# Patient Record
Sex: Male | Born: 1955
Health system: Southern US, Community
[De-identification: ages and names within clinical notes are randomized; demographics above are authoritative.]

## PROBLEM LIST (undated history)

## (undated) DIAGNOSIS — I10 Essential (primary) hypertension: Secondary | ICD-10-CM

---

## 2018-01-11 DIAGNOSIS — N529 Male erectile dysfunction, unspecified: Secondary | ICD-10-CM | POA: Diagnosis not present

## 2018-01-11 DIAGNOSIS — Z6826 Body mass index (BMI) 26.0-26.9, adult: Secondary | ICD-10-CM | POA: Diagnosis not present

## 2018-01-11 DIAGNOSIS — Z1331 Encounter for screening for depression: Secondary | ICD-10-CM | POA: Diagnosis not present

## 2018-01-11 DIAGNOSIS — H6983 Other specified disorders of Eustachian tube, bilateral: Secondary | ICD-10-CM | POA: Diagnosis not present

## 2018-01-30 DIAGNOSIS — Z130 Encounter for screening for diseases of the blood and blood-forming organs and certain disorders involving the immune mechanism: Secondary | ICD-10-CM | POA: Diagnosis not present

## 2018-01-30 DIAGNOSIS — R739 Hyperglycemia, unspecified: Secondary | ICD-10-CM | POA: Diagnosis not present

## 2018-01-30 DIAGNOSIS — Z13228 Encounter for screening for other metabolic disorders: Secondary | ICD-10-CM | POA: Diagnosis not present

## 2018-01-30 DIAGNOSIS — Z13220 Encounter for screening for lipoid disorders: Secondary | ICD-10-CM | POA: Diagnosis not present

## 2018-01-30 DIAGNOSIS — Z0000 Encounter for general adult medical examination without abnormal findings: Secondary | ICD-10-CM | POA: Diagnosis not present

## 2018-02-06 DIAGNOSIS — Z0000 Encounter for general adult medical examination without abnormal findings: Secondary | ICD-10-CM | POA: Diagnosis not present

## 2018-02-06 DIAGNOSIS — F1010 Alcohol abuse, uncomplicated: Secondary | ICD-10-CM | POA: Diagnosis not present

## 2018-02-06 DIAGNOSIS — Z1211 Encounter for screening for malignant neoplasm of colon: Secondary | ICD-10-CM | POA: Diagnosis not present

## 2018-02-06 DIAGNOSIS — Z6825 Body mass index (BMI) 25.0-25.9, adult: Secondary | ICD-10-CM | POA: Diagnosis not present

## 2018-06-22 DIAGNOSIS — J069 Acute upper respiratory infection, unspecified: Secondary | ICD-10-CM | POA: Diagnosis not present

## 2019-09-02 DIAGNOSIS — J069 Acute upper respiratory infection, unspecified: Secondary | ICD-10-CM | POA: Diagnosis not present

## 2019-09-02 DIAGNOSIS — R030 Elevated blood-pressure reading, without diagnosis of hypertension: Secondary | ICD-10-CM | POA: Diagnosis not present

## 2019-09-02 DIAGNOSIS — Z20822 Contact with and (suspected) exposure to covid-19: Secondary | ICD-10-CM | POA: Diagnosis not present

## 2019-10-08 DIAGNOSIS — N390 Urinary tract infection, site not specified: Secondary | ICD-10-CM | POA: Diagnosis not present

## 2019-10-11 ENCOUNTER — Ambulatory Visit
Admission: RE | Admit: 2019-10-11 | Discharge: 2019-10-11 | Disposition: A | Discharge status: Home / Self Care | Payer: BC Managed Care – PPO | Source: Ambulatory Visit | Attending: Family Medicine | Admitting: Family Medicine

## 2019-10-11 ENCOUNTER — Other Ambulatory Visit: Payer: Self-pay

## 2019-10-11 ENCOUNTER — Encounter: Payer: Self-pay | Admitting: Family Medicine

## 2019-10-11 ENCOUNTER — Ambulatory Visit: Payer: BC Managed Care – PPO | Admitting: Family Medicine

## 2019-10-11 VITALS — BP 146/86 | HR 74 | Temp 97.8°F | Resp 12 | Ht 75.0 in | Wt 198.5 lb

## 2019-10-11 DIAGNOSIS — Z13220 Encounter for screening for lipoid disorders: Secondary | ICD-10-CM

## 2019-10-11 DIAGNOSIS — I10 Essential (primary) hypertension: Secondary | ICD-10-CM

## 2019-10-11 DIAGNOSIS — Z7689 Persons encountering health services in other specified circumstances: Secondary | ICD-10-CM

## 2019-10-11 DIAGNOSIS — Z8249 Family history of ischemic heart disease and other diseases of the circulatory system: Secondary | ICD-10-CM

## 2019-10-11 DIAGNOSIS — I160 Hypertensive urgency: Secondary | ICD-10-CM | POA: Diagnosis not present

## 2019-10-11 DIAGNOSIS — R319 Hematuria, unspecified: Secondary | ICD-10-CM | POA: Diagnosis not present

## 2019-10-11 DIAGNOSIS — N410 Acute prostatitis: Secondary | ICD-10-CM | POA: Diagnosis not present

## 2019-10-11 DIAGNOSIS — R911 Solitary pulmonary nodule: Secondary | ICD-10-CM | POA: Diagnosis not present

## 2019-10-11 MED ORDER — TAMSULOSIN HCL 0.4 MG PO CAPS: 0.4000 mg | ORAL_CAPSULE | Freq: Every day | ORAL | 0 refills | Status: DC

## 2019-10-11 MED ORDER — LISINOPRIL 10 MG PO TABS: 10.0000 mg | ORAL_TABLET | Freq: Every day | ORAL | 3 refills | Status: DC

## 2019-10-11 NOTE — Progress Notes (Signed)
Name: Douglas Heath   MRN: [id number]    DOB: 1955/09/04   Date:10/11/2019       Progress Note  Chief Complaint  Patient presents with  . New Patient (Initial Visit)  . Hypertension    blood pressure also very high at urgent care 199/113 on monday.  Has been checking at home.  . Urinary Tract Infection    went to urgent care was blood in urine and sent for culture, and antibiotic given. I will call to try to get results     Subjective:   Douglas Heath is a 64 y.o. male, presents to clinic to est care, for f/up on urinary sx and BP high  Hx of prostatitis, went to [facility] 10/08/2019 for sx of urgency, straining, dysuria, rectal pressure, hematuria  He had similar sx over and over again in his 30's, states dx with prostatitis, one time a MD gave him 60 d of abx, and then his sx improved and never returned until last week. No family hx of prostate CA No abd pain, N, V, D, has normal BMs Urinary sx are feeling a little better but not yet resolved, he believes he was only given 7 d of bactrim.  No hematuria today.    Father with heart disease CABG, pacemaker, only started having health issues in his 76's, no other family hx of cardiac disease  Pt states he hasn't had a regular PCP but did have access to a concierge MD and has done some labs when trying to get health insurance   Since his BP was high at [facility], he got a home BP monitor and has been checking his BP at home and its' been very high, UC reading was around 140/90's per pt and at home his readings are  190's/110's.    His whole life he has been very healthy, he's a runner, cyclist, very fit and athletic, and starting Nov (only 2-3 months ago) due to COVID, he was uinable to exercise at all.  He states he drinks at least 2 cups off coffee and 4-5 red bulls, uses nuff all day long, and he has a very stressful job, just moved into a house in [place], and he states he's also a heavy drinker at least 4 shots of tequila a night.    In the past  couple days he stopped snuff, cut back on ETOH and stopped redbulls, has not exercised yet, despite this Bps were increasing with his home monitor.  He brought his monitor in with him today, readings are higher than manual BP in clinic. He denies CP, SOB, exertional sx, palpitations, LE edema, orthopnea, PND, near syncope, HA's, visual disturbances.  [place], he temporarily traveled to [place] during COVID to stay with his brother, and just moved back.  He has not other med hx, only remembers tonsils out as a child   Patient Active Problem List   Diagnosis Date Noted  . Hypertension 10/15/2019  . Family history of cardiac disorder in father 10/15/2019    History reviewed. No pertinent surgical history.  Family History  Problem Relation Age of Onset  . Diabetes Mother   . Heart disease Father   . Leukemia Maternal Grandmother     Social History   Socioeconomic History  . Marital status: Married    Spouse name: Douglas Heath  . Number of children: 2  . Years of education: 34  . Highest education level: Bachelor's degree (e.g., BA,  AB, BS)  Occupational History  . Not on file  Tobacco Use  . Smoking status: Never Smoker  . Smokeless tobacco: Current User  Substance and Sexual Activity  . Alcohol use: Yes  . Drug use: Never  . Sexual activity: Yes  Other Topics Concern  . Not on file  Social History Narrative  . Not on file   Social Determinants of Health   Financial Resource Strain:   . Difficulty of Paying Living Expenses: Not on file  Food Insecurity:   . Worried About Programme researcher, broadcasting/film/video in the Last Year: Not on file  . Ran Out of Food in the Last Year: Not on file  Transportation Needs:   . Lack of Transportation (Medical): Not on file  . Lack of Transportation (Non-Medical): Not on file  Physical Activity:   . Days of Exercise per Week: Not on file  . Minutes of Exercise per Session: Not on file  Stress:   . Feeling of Stress :  Not on file  Social Connections:   . Frequency of Communication with Friends and Family: Not on file  . Frequency of Social Gatherings with Friends and Family: Not on file  . Attends Religious Services: Not on file  . Active Member of Clubs or Organizations: Not on file  . Attends Banker Meetings: Not on file  . Marital Status: Not on file  Intimate Partner Violence:   . Fear of Current or Ex-Partner: Not on file  . Emotionally Abused: Not on file  . Physically Abused: Not on file  . Sexually Abused: Not on file     Current Outpatient Medications:  .  sulfamethoxazole-trimethoprim (BACTRIM DS) 800-160 MG tablet, Take 1 tablet by mouth 2 (two) times daily., Disp: , Rfl:   No Known Allergies  Chart Review Today: I personally reviewed active problem list, medication list, allergies, family history, social history, health maintenance, notes from last encounter, lab results, imaging with the patient/caregiver today.   Review of Systems  Constitutional: Negative.   HENT: Negative.   Eyes: Negative.   Respiratory: Negative.   Cardiovascular: Negative.   Gastrointestinal: Negative.   Endocrine: Negative.   Genitourinary: Negative.   Musculoskeletal: Negative.   Skin: Negative.   Allergic/Immunologic: Negative.   Neurological: Negative.   Hematological: Negative.   Psychiatric/Behavioral: Negative.   All other systems reviewed and are negative.    Objective:    Vitals:   10/11/19 1035 10/11/19 1049  BP: (!) 142/88 (!) 146/86  Pulse: 74   Resp: 12   Temp: 97.8 F (36.6 C)   SpO2: 98%   Weight: 198 lb 8 oz (90 kg)   Height: 6\' 3"  (1.905 m)     Body mass index is 24.81 kg/m.  Physical Exam Vitals and nursing note reviewed.  Constitutional:      General: He is not in acute distress.    Appearance: Normal appearance. He is well-developed. He is not ill-appearing, toxic-appearing or diaphoretic.     Interventions: Face mask in place.  HENT:     Head:  Normocephalic and atraumatic.     Jaw: No trismus.     Right Ear: External ear normal.     Left Ear: External ear normal.  Eyes:     General: Lids are normal. No scleral icterus.    Conjunctiva/sclera: Conjunctivae normal.     Pupils: Pupils are equal, round, and reactive to light.  Neck:     Trachea: Trachea and phonation normal. No  tracheal deviation.  Cardiovascular:     Rate and Rhythm: Normal rate and regular rhythm.     Pulses: Normal pulses.          Radial pulses are 2+ on the right side and 2+ on the left side.       Posterior tibial pulses are 2+ on the right side and 2+ on the left side.     Heart sounds: Normal heart sounds. No murmur. No friction rub. No gallop.   Pulmonary:     Effort: Pulmonary effort is normal. No respiratory distress.     Breath sounds: Normal breath sounds. No stridor. No wheezing, rhonchi or rales.  Abdominal:     General: Bowel sounds are normal. There is no distension.     Palpations: Abdomen is soft.     Tenderness: There is no abdominal tenderness. There is no guarding or rebound.  Musculoskeletal:        General: Normal range of motion.     Cervical back: Normal range of motion and neck supple.     Right lower leg: No edema.     Left lower leg: No edema.  Skin:    General: Skin is warm and dry.     Capillary Refill: Capillary refill takes less than 2 seconds.     Coloration: Skin is not jaundiced.     Findings: No rash.     Nails: There is no clubbing.  Neurological:     Mental Status: He is alert.     Cranial Nerves: No dysarthria or facial asymmetry.     Motor: No tremor or abnormal muscle tone.     Gait: Gait normal.  Psychiatric:        Mood and Affect: Mood normal.        Speech: Speech normal.        Behavior: Behavior normal. Behavior is cooperative.     ECG interpretation   Date: 10/11/19  Rate: 54  Rhythm: sinus brady  QRS Axis: normal  Intervals: normal  ST/T Wave abnormalities: TWI in inferior leads   Conduction  Disutrbances: none  Old EKG Reviewed: none available Consulted with SP on ECG - per Dr. Carlynn Purl refer to Cardiology    PHQ2/9: Depression screen [facility] 2/9 10/11/2019  Decreased Interest 0  Down, Depressed, Hopeless 0  PHQ - 2 Score 0  Altered sleeping 0  Tired, decreased energy 0  Change in appetite 0  Feeling bad or failure about yourself  0  Trouble concentrating 0  Moving slowly or fidgety/restless 0  Suicidal thoughts 0  PHQ-9 Score 0  Difficult doing work/chores Not difficult at all    phq 9 is neg, reviewed today  Fall Risk: Fall Risk  10/11/2019  Falls in the past year? 0  Number falls in past yr: 0  Injury with Fall? 0    Functional Status Survey: Is the patient deaf or have difficulty hearing?: No Does the patient have difficulty seeing, even when wearing glasses/contacts?: No Does the patient have difficulty concentrating, remembering, or making decisions?: No Does the patient have difficulty walking or climbing stairs?: No Does the patient have difficulty dressing or bathing?: No Does the patient have difficulty doing errands alone such as visiting a doctor's office or shopping?: No   Assessment & Plan:     ICD-10-CM   1. Hypertension, unspecified type  I10 COMPLETE METABOLIC PANEL WITH GFR    lisinopril (ZESTRIL) 10 MG tablet   I suspect years of HTN, he  is dedicated to lifestyle changes, monitor falsly high - suggested other monitor and close f/up, start meds, check labs  2. Hypertensive urgency  I16.0 CBC with Differential/Platelet    COMPLETE METABOLIC PANEL WITH GFR    Lipid panel    Urinalysis, Routine w reflex microscopic    Microalbumin / creatinine urine ratio    EKG 12-Lead    DG Chest 2 View    Brain natriuretic peptide    lisinopril (ZESTRIL) 10 MG tablet   asx, will assess for end organ damage - EKG abd with TWI in inferior leads, so signs of STEMI or strain, labs and CXR, no past labs and VS to compare to  3. Hematuria, unspecified type   R31.9 sulfamethoxazole-trimethoprim (BACTRIM DS) 800-160 MG tablet    Urinalysis, Routine w reflex microscopic    tamsulosin (FLOMAX) 0.4 MG CAPS capsule   [facility] sx [facility] 3[facility]4 d [facility] - on bactrim, sx improving but not resolved, hx of prostatitis feels like his sx in the past  4. Acute prostatitis  N41.0 sulfamethoxazole-trimethoprim (BACTRIM DS) 800-160 MG tablet    tamsulosin (FLOMAX) 0.4 MG CAPS capsule   will need to change abx or at least continue abx for an additional 1-2 weeks, attempting to get UC records, rechecking urine today, added flomax  5. Family history of cardiac disorder in father  M09.47 COMPLETE METABOLIC PANEL WITH GFR    Lipid panel    EKG 12-Lead    DG Chest 2 View  6. Encounter to establish care with new doctor  Z76.89   7. Screening for lipoid disorders  S96.283 COMPLETE METABOLIC PANEL WITH GFR    Lipid panel   Discussed pt's ECG with him, would like to see him back in 2 weeks and reassess - discussed cardiology referral.  Return in about 2 weeks (around 10/25/2019) for bp recheck urine f/up.   Delsa Grana, PA-C 10/11/19 10:53 AM

## 2019-10-11 NOTE — Patient Instructions (Signed)
Hypertension, Adult Hypertension is another name for high blood pressure. High blood pressure forces your heart to work harder to pump blood. This can cause problems over time. There are two numbers in a blood pressure reading. There is a top number (systolic) over a bottom number (diastolic). It is best to have a blood pressure that is below 120/80. Healthy choices can help lower your blood pressure, or you may need medicine to help lower it. What are the causes? The cause of this condition is not known. Some conditions may be related to high blood pressure. What increases the risk?  Smoking.  Having type 2 diabetes mellitus, high cholesterol, or both.  Not getting enough exercise or physical activity.  Being overweight.  Having too much fat, sugar, calories, or salt (sodium) in your diet.  Drinking too much alcohol.  Having long-term (chronic) kidney disease.  Having a family history of high blood pressure.  Age. Risk increases with age.  Race. You may be at higher risk if you are African American.  Gender. Men are at higher risk than women before age 45. After age 65, women are at higher risk than men.  Having obstructive sleep apnea.  Stress. What are the signs or symptoms?  High blood pressure may not cause symptoms. Very high blood pressure (hypertensive crisis) may cause: ? Headache. ? Feelings of worry or nervousness (anxiety). ? Shortness of breath. ? Nosebleed. ? A feeling of being sick to your stomach (nausea). ? Throwing up (vomiting). ? Changes in how you see. ? Very bad chest pain. ? Seizures. How is this treated?  This condition is treated by making healthy lifestyle changes, such as: ? Eating healthy foods. ? Exercising more. ? Drinking less alcohol.  Your health care provider may prescribe medicine if lifestyle changes are not enough to get your blood pressure under control, and if: ? Your top number is above 130. ? Your bottom number is above  80.  Your personal target blood pressure may vary. Follow these instructions at home: Eating and drinking   If told, follow the DASH eating plan. To follow this plan: ? Fill one half of your plate at each meal with fruits and vegetables. ? Fill one fourth of your plate at each meal with whole grains. Whole grains include whole-wheat pasta, brown rice, and whole-grain bread. ? Eat or drink low-fat dairy products, such as skim milk or low-fat yogurt. ? Fill one fourth of your plate at each meal with low-fat (lean) proteins. Low-fat proteins include fish, chicken without skin, eggs, beans, and tofu. ? Avoid fatty meat, cured and processed meat, or chicken with skin. ? Avoid pre-made or processed food.  Eat less than 1,500 mg of salt each day.  Do not drink alcohol if: ? Your doctor tells you not to drink. ? You are pregnant, may be pregnant, or are planning to become pregnant.  If you drink alcohol: ? Limit how much you use to:  0-1 drink a day for women.  0-2 drinks a day for men. ? Be aware of how much alcohol is in your drink. In the [place] one drink equals one 12 oz bottle of beer (355 mL), one 5 oz glass of wine (148 mL), or one 1 oz glass of hard liquor (44 mL). Lifestyle   Work with your doctor to stay at a healthy weight or to lose weight. Ask your doctor what the best weight is for you.  Get at least 30 minutes of exercise most   days of the week. This may include walking, swimming, or biking.  Get at least 30 minutes of exercise that strengthens your muscles (resistance exercise) at least 3 days a week. This may include lifting weights or doing Pilates.  Do not use any products that contain nicotine or tobacco, such as cigarettes, e-cigarettes, and chewing tobacco. If you need help quitting, ask your doctor.  Check your blood pressure at home as told by your doctor.  Keep all follow-up visits as told by your doctor. This is important. Medicines  Take over-the-counter  and prescription medicines only as told by your doctor. Follow directions carefully.  Do not skip doses of blood pressure medicine. The medicine does not work as well if you skip doses. Skipping doses also puts you at risk for problems.  Ask your doctor about side effects or reactions to medicines that you should watch for. Contact a doctor if you:  Think you are having a reaction to the medicine you are taking.  Have headaches that keep coming back (recurring).  Feel dizzy.  Have swelling in your ankles.  Have trouble with your vision. Get help right away if you:  Get a very bad headache.  Start to feel mixed up (confused).  Feel weak or numb.  Feel faint.  Have very bad pain in your: ? Chest. ? Belly (abdomen).  Throw up more than once.  Have trouble breathing. Summary  Hypertension is another name for high blood pressure.  High blood pressure forces your heart to work harder to pump blood.  For most people, a normal blood pressure is less than 120/80.  Making healthy choices can help lower blood pressure. If your blood pressure does not get lower with healthy choices, you may need to take medicine. This information is not intended to replace advice given to you by your health care provider. Make sure you discuss any questions you have with your health care provider. Document Revised: 04/26/2018 Document Reviewed: 04/26/2018 Elsevier Patient Education  2020 [employer] Inc[employer]  DASH Eating Plan DASH stands for "Dietary Approaches to Stop Hypertension." The DASH eating plan is a healthy eating plan that has been shown to reduce high blood pressure (hypertension). It may also reduce your risk for type 2 diabetes, heart disease, and stroke. The DASH eating plan may also help with weight loss. What are tips for following this plan?  General guidelines  Avoid eating more than 2,300 mg (milligrams) of salt (sodium) a day. If you have hypertension, you may need to reduce  your sodium intake to 1,500 mg a day.  Limit alcohol intake to no more than 1 drink a day for nonpregnant women and 2 drinks a day for men. One drink equals 12 oz of beer, 5 oz of wine, or 1 oz of hard liquor.  Work with your health care provider to maintain a healthy body weight or to lose weight. Ask what an ideal weight is for you.  Get at least 30 minutes of exercise that causes your heart to beat faster (aerobic exercise) most days of the week. Activities may include walking, swimming, or biking.  Work with your health care provider or diet and nutrition specialist (dietitian) to adjust your eating plan to your individual calorie needs. Reading food labels   Check food labels for the amount of sodium per serving. Choose foods with less than 5 percent of the Daily Value of sodium. Generally, foods with less than 300 mg of sodium per serving fit into this eating  plan.  To find whole grains, look for the word "whole" as the first word in the ingredient list. Shopping  Buy products labeled as "low-sodium" or "no salt added."  Buy fresh foods. Avoid canned foods and premade or frozen meals. Cooking  Avoid adding salt when cooking. Use salt-free seasonings or herbs instead of table salt or sea salt. Check with your health care provider or pharmacist before using salt substitutes.  Do not fry foods. Cook foods using healthy methods such as baking, boiling, grilling, and broiling instead.  Cook with heart-healthy oils, such as olive, canola, soybean, or sunflower oil. Meal planning  Eat a balanced diet that includes: ? 5 or more servings of fruits and vegetables each day. At each meal, try to fill half of your plate with fruits and vegetables. ? Up to 6-8 servings of whole grains each day. ? Less than 6 oz of lean meat, poultry, or fish each day. A 3-oz serving of meat is about the same size as a deck of cards. One egg equals 1 oz. ? 2 servings of low-fat dairy each day. ? A serving  of nuts, seeds, or beans 5 times each week. ? Heart-healthy fats. Healthy fats called Omega-3 fatty acids are found in foods such as flaxseeds and coldwater fish, like sardines, salmon, and mackerel.  Limit how much you eat of the following: ? Canned or prepackaged foods. ? Food that is high in trans fat, such as fried foods. ? Food that is high in saturated fat, such as fatty meat. ? Sweets, desserts, sugary drinks, and other foods with added sugar. ? Full-fat dairy products.  Do not salt foods before eating.  Try to eat at least 2 vegetarian meals each week.  Eat more home-cooked food and less restaurant, buffet, and fast food.  When eating at a restaurant, ask that your food be prepared with less salt or no salt, if possible. What foods are recommended? The items listed may not be a complete list. Talk with your dietitian about what dietary choices are best for you. Grains Whole-grain or whole-wheat bread. Whole-grain or whole-wheat pasta. Brown rice. Orpah Cobb. Bulgur. Whole-grain and low-sodium cereals. Pita bread. Low-fat, low-sodium crackers. Whole-wheat flour tortillas. Vegetables Fresh or frozen vegetables (raw, steamed, roasted, or grilled). Low-sodium or reduced-sodium tomato and vegetable juice. Low-sodium or reduced-sodium tomato sauce and tomato paste. Low-sodium or reduced-sodium canned vegetables. Fruits All fresh, dried, or frozen fruit. Canned fruit in natural juice (without added sugar). Meat and other protein foods Skinless chicken or [place]. Ground chicken or [place]. Pork with fat trimmed off. Fish and seafood. Egg whites. Dried beans, peas, or lentils. Unsalted nuts, nut butters, and seeds. Unsalted canned beans. Lean cuts of beef with fat trimmed off. Low-sodium, lean deli meat. Dairy Low-fat (1%) or fat-free (skim) milk. Fat-free, low-fat, or reduced-fat cheeses. Nonfat, low-sodium ricotta or cottage cheese. Low-fat or nonfat yogurt. Low-fat, low-sodium  cheese. Fats and oils Soft margarine without trans fats. Vegetable oil. Low-fat, reduced-fat, or light mayonnaise and salad dressings (reduced-sodium). Canola, safflower, olive, soybean, and sunflower oils. Avocado. Seasoning and other foods Herbs. Spices. Seasoning mixes without salt. Unsalted popcorn and pretzels. Fat-free sweets. What foods are not recommended? The items listed may not be a complete list. Talk with your dietitian about what dietary choices are best for you. Grains Baked goods made with fat, such as croissants, muffins, or some breads. Dry pasta or rice meal packs. Vegetables Creamed or fried vegetables. Vegetables in a cheese sauce. Regular canned  vegetables (not low-sodium or reduced-sodium). Regular canned tomato sauce and paste (not low-sodium or reduced-sodium). Regular tomato and vegetable juice (not low-sodium or reduced-sodium). Angie Fava. Olives. Fruits Canned fruit in a light or heavy syrup. Fried fruit. Fruit in cream or butter sauce. Meat and other protein foods Fatty cuts of meat. Ribs. Fried meat. Berniece Salines. Sausage. Bologna and other processed lunch meats. Salami. Fatback. Hotdogs. Bratwurst. Salted nuts and seeds. Canned beans with added salt. Canned or smoked fish. Whole eggs or egg yolks. Chicken or [place] with skin. Dairy Whole or 2% milk, cream, and half-and-half. Whole or full-fat cream cheese. Whole-fat or sweetened yogurt. Full-fat cheese. Nondairy creamers. Whipped toppings. Processed cheese and cheese spreads. Fats and oils Butter. Stick margarine. Lard. Shortening. Ghee. Bacon fat. Tropical oils, such as coconut, palm kernel, or palm oil. Seasoning and other foods Salted popcorn and pretzels. Onion salt, garlic salt, seasoned salt, table salt, and sea salt. Worcestershire sauce. Tartar sauce. Barbecue sauce. Teriyaki sauce. Soy sauce, including reduced-sodium. Steak sauce. Canned and packaged gravies. Fish sauce. Oyster sauce. Cocktail sauce. Horseradish  that you find on the shelf. Ketchup. Mustard. Meat flavorings and tenderizers. Bouillon cubes. Hot sauce and Tabasco sauce. Premade or packaged marinades. Premade or packaged taco seasonings. Relishes. Regular salad dressings. Where to find more information:  National Heart, Lung, and [place]: https://wilson-eaton.com/  American Heart Association: www.heart.org Summary  The DASH eating plan is a healthy eating plan that has been shown to reduce high blood pressure (hypertension). It may also reduce your risk for type 2 diabetes, heart disease, and stroke.  With the DASH eating plan, you should limit salt (sodium) intake to 2,300 mg a day. If you have hypertension, you may need to reduce your sodium intake to 1,500 mg a day.  When on the DASH eating plan, aim to eat more fresh fruits and vegetables, whole grains, lean proteins, low-fat dairy, and heart-healthy fats.  Work with your health care provider or diet and nutrition specialist (dietitian) to adjust your eating plan to your individual calorie needs. This information is not intended to replace advice given to you by your health care provider. Make sure you discuss any questions you have with your health care provider. Document Revised: 07/29/2017 Document Reviewed: 08/09/2016 Elsevier Patient Education  2020 [employer].   How to Take Your Blood Pressure You can take your blood pressure at home with a machine. You may need to check your blood pressure at home:  To check if you have high blood pressure (hypertension).  To check your blood pressure over time.  To make sure your blood pressure medicine is working. Supplies needed: You will need a blood pressure machine, or monitor. You can buy one at a drugstore or online. When choosing one:  Choose one with an arm cuff.  Choose one that wraps around your upper arm. Only one finger should fit between your arm and the cuff.  Do not choose one that measures your blood pressure  from your wrist or finger. Your doctor can suggest a monitor. How to prepare Avoid these things for 30 minutes before checking your blood pressure:  Drinking caffeine.  Drinking alcohol.  Eating.  Smoking.  Exercising. Five minutes before checking your blood pressure:  Pee.  Sit in a dining chair. Avoid sitting in a soft couch or armchair.  Be quiet. Do not talk. How to take your blood pressure Follow the instructions that came with your machine. If you have a digital blood pressure monitor, these may  be the instructions: 1. Sit up straight. 2. Place your feet on the floor. Do not cross your ankles or legs. 3. Rest your left arm at the level of your heart. You may rest it on a table, desk, or chair. 4. Pull up your shirt sleeve. 5. Wrap the blood pressure cuff around the upper part of your left arm. The cuff should be 1 inch (2.5 cm) above your elbow. It is best to wrap the cuff around bare skin. 6. Fit the cuff snugly around your arm. You should be able to place only one finger between the cuff and your arm. 7. Put the cord inside the groove of your elbow. 8. Press the power button. 9. Sit quietly while the cuff fills with air and loses air. 10. Write down the numbers on the screen. 11. Wait 2-3 minutes and then repeat steps 1-10. What do the numbers mean? Two numbers make up your blood pressure. The first number is called systolic pressure. The second is called diastolic pressure. An example of a blood pressure reading is "120 over 80" (or 120/80). If you are an adult and do not have a medical condition, use this guide to find out if your blood pressure is normal: Normal  First number: below 120.  Second number: below 80. Elevated  First number: 120-129.  Second number: below 80. Hypertension stage 1  First number: 130-139.  Second number: 80-89. Hypertension stage 2  First number: 140 or above.  Second number: 90 or above. Your blood pressure is above  normal even if only the top or bottom number is above normal. Follow these instructions at home:  Check your blood pressure as often as your doctor tells you to.  Take your monitor to your next doctor's appointment. Your doctor will: ? Make sure you are using it correctly. ? Make sure it is working right.  Make sure you understand what your blood pressure numbers should be.  Tell your doctor if your medicines are causing side effects. Contact a doctor if:  Your blood pressure keeps being high. Get help right away if:  Your first blood pressure number is higher than 180.  Your second blood pressure number is higher than 120. This information is not intended to replace advice given to you by your health care provider. Make sure you discuss any questions you have with your health care provider. Document Revised: 07/29/2017 Document Reviewed: 01/23/2016 Elsevier Patient Education  2020 Elsevier Inc.   Blood Pressure Record Sheet To take your blood pressure, you will need a blood pressure machine. You can buy a blood pressure machine (blood pressure monitor) at your clinic, drug store, or online. When choosing one, consider:  An automatic monitor that has an arm cuff.  A cuff that wraps snugly around your upper arm. You should be able to fit only one finger between your arm and the cuff.  A device that stores blood pressure reading results.  Do not choose a monitor that measures your blood pressure from your wrist or finger. Follow your health care provider's instructions for how to take your blood pressure. To use this form:  Get one reading in the morning (a.m.) before you take any medicines.  Get one reading in the evening (p.m.) before supper.  Take at least 2 readings with each blood pressure check. This makes sure the results are correct. Wait 1-2 minutes between measurements.  Write down the results in the spaces on this form.  Repeat this once a  week, or as told by  your health care provider.  Make a follow-up appointment with your health care provider to discuss the results. Blood pressure log Date: _______________________  a.m. _____________________(1st reading) _____________________(2nd reading)  p.m. _____________________(1st reading) _____________________(2nd reading) Date: _______________________  a.m. _____________________(1st reading) _____________________(2nd reading)  p.m. _____________________(1st reading) _____________________(2nd reading) Date: _______________________  a.m. _____________________(1st reading) _____________________(2nd reading)  p.m. _____________________(1st reading) _____________________(2nd reading) Date: _______________________  a.m. _____________________(1st reading) _____________________(2nd reading)  p.m. _____________________(1st reading) _____________________(2nd reading) Date: _______________________  a.m. _____________________(1st reading) _____________________(2nd reading)  p.m. _____________________(1st reading) _____________________(2nd reading) This information is not intended to replace advice given to you by your health care provider. Make sure you discuss any questions you have with your health care provider. Document Revised: 10/14/2017 Document Reviewed: 08/16/2017 Elsevier Patient Education  2020 [employer].

## 2019-10-12 ENCOUNTER — Telehealth: Payer: Self-pay

## 2019-10-12 LAB — MICROALBUMIN / CREATININE URINE RATIO
Creatinine, Urine: 30 mg/dL (ref 20–320)
Microalb Creat Ratio: 17 mcg/mg creat (ref <30)
Microalb, Ur: 0.5 mg/dL

## 2019-10-12 LAB — CBC WITH DIFFERENTIAL/PLATELET
Absolute Monocytes: 316 cells/uL (ref 200–950)
Basophils Absolute: 28 cells/uL (ref 0–200)
Basophils Relative: 0.7 %
Eosinophils Absolute: 48 cells/uL (ref 15–500)
Eosinophils Relative: 1.2 %
HCT: 44.5 % (ref 38.5–50.0)
Hemoglobin: 15.8 g/dL (ref 13.2–17.1)
Lymphs Abs: 1188 cells/uL (ref 850–3900)
MCH: 32 pg (ref 27.0–33.0)
MCHC: 35.5 g/dL (ref 32.0–36.0)
MCV: 90.3 fL (ref 80.0–100.0)
MPV: 9.5 fL (ref 7.5–12.5)
Monocytes Relative: 7.9 %
Neutro Abs: 2420 cells/uL (ref 1500–7800)
Neutrophils Relative %: 60.5 %
Platelets: 168 10*3/uL (ref 140–400)
RBC: 4.93 10*6/uL (ref 4.20–5.80)
RDW: 13.1 % (ref 11.0–15.0)
Total Lymphocyte: 29.7 %
WBC: 4 10*3/uL (ref 3.8–10.8)

## 2019-10-12 LAB — COMPLETE METABOLIC PANEL WITH GFR
AG Ratio: 2.5 (calc) (ref 1.0–2.5)
ALT: 23 U/L (ref 9–46)
AST: 17 U/L (ref 10–35)
Albumin: 4.8 g/dL (ref 3.6–5.1)
Alkaline phosphatase (APISO): 56 U/L (ref 35–144)
BUN: 14 mg/dL (ref 7–25)
CO2: 30 mmol/L (ref 20–32)
Calcium: 9.3 mg/dL (ref 8.6–10.3)
Chloride: 105 mmol/L (ref 98–110)
Creat: 1.13 mg/dL (ref 0.70–1.25)
GFR, Est African American: 81 mL/min/{1.73_m2} (ref >=60)
GFR, Est Non African American: 70 mL/min/{1.73_m2} (ref >=60)
Globulin: 1.9 g/dL (calc) (ref 1.9–3.7)
Glucose, Bld: 101 mg/dL — ABNORMAL HIGH (ref 65–99)
Potassium: 4.9 mmol/L (ref 3.5–5.3)
Sodium: 139 mmol/L (ref 135–146)
Total Bilirubin: 0.6 mg/dL (ref 0.2–1.2)
Total Protein: 6.7 g/dL (ref 6.1–8.1)

## 2019-10-12 LAB — URINALYSIS, ROUTINE W REFLEX MICROSCOPIC
Bilirubin Urine: NEGATIVE
Glucose, UA: NEGATIVE
Hgb urine dipstick: NEGATIVE
Ketones, ur: NEGATIVE
Leukocytes,Ua: NEGATIVE
Nitrite: NEGATIVE
Protein, ur: NEGATIVE
Specific Gravity, Urine: 1.007 (ref 1.001–1.03)
pH: >=8.5 — AB (ref 5.0–8.0)

## 2019-10-12 LAB — LIPID PANEL
Cholesterol: 198 mg/dL (ref <200)
HDL: 60 mg/dL (ref >=40)
LDL Cholesterol (Calc): 122 mg/dL (calc) — ABNORMAL HIGH
Non-HDL Cholesterol (Calc): 138 mg/dL (calc) — ABNORMAL HIGH (ref <130)
Total CHOL/HDL Ratio: 3.3 (calc) (ref <5.0)
Triglycerides: 70 mg/dL (ref <150)

## 2019-10-12 LAB — BRAIN NATRIURETIC PEPTIDE: Brain Natriuretic Peptide: 14 pg/mL (ref <100)

## 2019-10-12 MED ORDER — CIPROFLOXACIN HCL 500 MG PO TABS: 500.0000 mg | ORAL_TABLET | Freq: Two times a day (BID) | ORAL | 0 refills | Status: DC

## 2019-10-12 NOTE — Telephone Encounter (Signed)
Copied from CRM [id number]. Topic: General - Inquiry >> Oct 12, 2019 12:43 PM Crist Infante wrote: Reason for CRM: Pt saw Leisa yesterday and was awaiting UA results.   Pt got the results of the culture back and it was negative.  They advise pt this is not a UTI.  Pt states it still hurts and burns to urinate, so something is wrong. pt would like Leisa or nurseto give him a call.

## 2019-10-12 NOTE — Telephone Encounter (Signed)
Labs normal please advise

## 2019-10-15 ENCOUNTER — Encounter: Payer: Self-pay | Admitting: Family Medicine

## 2019-10-15 DIAGNOSIS — Z8249 Family history of ischemic heart disease and other diseases of the circulatory system: Secondary | ICD-10-CM | POA: Insufficient documentation

## 2019-10-15 DIAGNOSIS — I10 Essential (primary) hypertension: Secondary | ICD-10-CM | POA: Insufficient documentation

## 2019-10-15 NOTE — Telephone Encounter (Signed)
Patient called back message received.Marland Kitchen

## 2019-10-15 NOTE — Telephone Encounter (Signed)
Left detailed voicemail

## 2019-10-16 ENCOUNTER — Telehealth: Payer: Self-pay

## 2019-10-16 DIAGNOSIS — Z23 Encounter for immunization: Secondary | ICD-10-CM | POA: Diagnosis not present

## 2019-10-16 NOTE — Telephone Encounter (Signed)
Douglas Heath, CMA  10/15/2019 10:10 AM EST    Pt refuses chol med at this time, he will work really hard on diet and exercise   Routing to [employer].

## 2019-10-22 ENCOUNTER — Telehealth: Payer: Self-pay | Admitting: Family Medicine

## 2019-10-22 NOTE — Telephone Encounter (Signed)
Pt seen leisa on 10-11-2019. Pt is still having burning and frequency when urinating. Pt also has fatigue. Please advise

## 2019-10-22 NOTE — Telephone Encounter (Signed)
Called pt denies any cardiac symptoms will follow-up with you tomorrow at 3:40.

## 2019-10-23 ENCOUNTER — Other Ambulatory Visit: Payer: Self-pay

## 2019-10-23 ENCOUNTER — Ambulatory Visit
Admission: RE | Admit: 2019-10-23 | Discharge: 2019-10-23 | Disposition: A | Discharge status: Home / Self Care | Payer: BC Managed Care – PPO | Source: Ambulatory Visit | Attending: Family Medicine | Admitting: Family Medicine

## 2019-10-23 ENCOUNTER — Ambulatory Visit: Payer: BC Managed Care – PPO | Admitting: Family Medicine

## 2019-10-23 ENCOUNTER — Encounter: Payer: Self-pay | Admitting: Family Medicine

## 2019-10-23 VITALS — BP 144/94 | HR 81 | Temp 97.1°F | Resp 16 | Ht 75.0 in | Wt 198.3 lb

## 2019-10-23 DIAGNOSIS — N4889 Other specified disorders of penis: Secondary | ICD-10-CM | POA: Diagnosis not present

## 2019-10-23 DIAGNOSIS — R319 Hematuria, unspecified: Secondary | ICD-10-CM | POA: Insufficient documentation

## 2019-10-23 DIAGNOSIS — N132 Hydronephrosis with renal and ureteral calculous obstruction: Secondary | ICD-10-CM | POA: Diagnosis not present

## 2019-10-23 DIAGNOSIS — R109 Unspecified abdominal pain: Secondary | ICD-10-CM | POA: Diagnosis not present

## 2019-10-23 DIAGNOSIS — N410 Acute prostatitis: Secondary | ICD-10-CM | POA: Diagnosis not present

## 2019-10-23 DIAGNOSIS — N400 Enlarged prostate without lower urinary tract symptoms: Secondary | ICD-10-CM

## 2019-10-23 DIAGNOSIS — R911 Solitary pulmonary nodule: Secondary | ICD-10-CM

## 2019-10-23 DIAGNOSIS — R300 Dysuria: Secondary | ICD-10-CM | POA: Insufficient documentation

## 2019-10-23 DIAGNOSIS — N289 Disorder of kidney and ureter, unspecified: Secondary | ICD-10-CM

## 2019-10-23 DIAGNOSIS — N200 Calculus of kidney: Secondary | ICD-10-CM | POA: Diagnosis not present

## 2019-10-23 DIAGNOSIS — K5730 Diverticulosis of large intestine without perforation or abscess without bleeding: Secondary | ICD-10-CM | POA: Insufficient documentation

## 2019-10-23 LAB — POCT URINALYSIS DIPSTICK
Bilirubin, UA: NEGATIVE
Blood, UA: POSITIVE
Glucose, UA: NEGATIVE
Ketones, UA: NEGATIVE
Leukocytes, UA: NEGATIVE
Nitrite, UA: NEGATIVE
Odor: NORMAL
Protein, UA: NEGATIVE
Spec Grav, UA: 1.02 (ref 1.010–1.025)
Urobilinogen, UA: 0.2 E.U./dL
pH, UA: 7 (ref 5.0–8.0)

## 2019-10-23 MED ORDER — PHENAZOPYRIDINE HCL 200 MG PO TABS: 200.0000 mg | ORAL_TABLET | Freq: Three times a day (TID) | ORAL | 0 refills | Status: DC

## 2019-10-23 MED ORDER — TAMSULOSIN HCL 0.4 MG PO CAPS: 0.8000 mg | ORAL_CAPSULE | Freq: Every day | ORAL | 0 refills | Status: DC

## 2019-10-23 MED ORDER — KETOROLAC TROMETHAMINE 60 MG/2ML IM SOLN
60.0000 mg | Freq: Once | INTRAMUSCULAR | Status: AC
  Administered 2019-10-23: 60 mg via INTRAMUSCULAR

## 2019-10-23 MED ORDER — TRAMADOL HCL 50 MG PO TABS: 50.0000 mg | ORAL_TABLET | Freq: Three times a day (TID) | ORAL | 0 refills | Status: DC | PRN

## 2019-10-23 MED ORDER — ONDANSETRON 4 MG PO TBDP: 4.0000 mg | ORAL_TABLET | Freq: Three times a day (TID) | ORAL | 0 refills | Status: DC | PRN

## 2019-10-23 NOTE — Patient Instructions (Signed)
Increase flomax dose at night  Start pyridium for pain  Continue antibiotics  Can take tramadol for pain as needed and zofran as needed for nausea  Any severe worsening go to ER or Urgent care for further help/evaluation/treatment  Hopefully with repeated urine testing and CT we can determine the cause of this  I have put in for urology referral - so you should hear from them in the next 1-2 days

## 2019-10-23 NOTE — Progress Notes (Signed)
Patient ID: Douglas Heath, male    DOB: 1956-08-09, 64 y.o.   MRN: [id number]  PCP: Danelle Berry, PA-C  Chief Complaint  Patient presents with  . Dysuria    Subjective:   Douglas Heath is a 64 y.o. male, presents to clinic with CC of the following: Patient presented for follow-up on increased dysuria became much more severe yesterday with associated generalized malaise and fatigue.  He send [place] a MyChart message and he was encouraged to come in today for evaluation.  Patient established here 12 days ago, he had urinary urgency straining dysuria, rectal pressure and hematuria that started on 10/08/2019 he originally presented to urgent care was given some antibiotics and followed up in our office about 3 days later at that time he noted that it was similar to past episodes of prostatitis when he was about 32, he stated that he was beginning to feel little bit better, his urine was rechecked with a culture added -urinalysis, microscopy and urine culture were all negative he had no hematuria at the time of office visit on 2/11.  We discussed at that time him finishing the Bactrim that he was given from urgent care since he was starting to feel better and we would likely switch him to a different medication for longer coverage of prostatitis we decided on Cipro for him to continue for an additional 2 weeks and he did have a follow-up scheduled before he would have completed his antibiotics.  Unfortunately he did call the office shortly after his visit and then again on Monday, yesterday concerned about continued urinary symptoms.  He was encouraged to take the Cipro, he had been given Flomax and despite completing Bactrim and taking over a week of Cipro yesterday his pain began very intense and severe.  When pain is been extremely severe he has had some mild associated nausea.  No other alleviating or aggravating factors  Pain has been constant with some waves of worsening or more intense pain he states is  located throughout his penis and urethra, and has worsened over the last 1 to 2 days and now has pain constantly from his low suprapubic area to the end of his penis.  He states it is persistent, so severe he was unable to sleep.  He noted decreased appetite, generally feels malaised and fatigued, was able to work today but was very stressed. He denies any change to urine output, urinary frequency, denies penile discharge, denies any scrotal or testicle pain swelling, denies any genital rash lesions or possibility of STIs, denies hematuria or change in urine color or odor.  He has no upper abdominal pain denies any flank or back pain.   Further denies any fever chills sweats vomiting diarrhea, constipation, abdominal bloating, indigestion.  Urine dip from today was positive for blood and otherwise unremarkable Results for orders placed or performed in visit on 10/23/19  POCT Urinalysis Dipstick  Result Value Ref Range   Color, UA yellow    Clarity, UA clear    Glucose, UA Negative Negative   Bilirubin, UA neg    Ketones, UA neg    Spec Grav, UA 1.020 1.010 - 1.025   Blood, UA positive    pH, UA 7.0 5.0 - 8.0   Protein, UA Negative Negative   Urobilinogen, UA 0.2 0.2 or 1.0 E.U./dL   Nitrite, UA neg    Leukocytes, UA Negative Negative   Appearance clear    Odor normal  Patient Active Problem List   Diagnosis Date Noted  . Hypertension 10/15/2019  . Family history of cardiac disorder in father 10/15/2019      Current Outpatient Medications:  .  ciprofloxacin (CIPRO) 500 MG tablet, Take 1 tablet (500 mg total) by mouth every 12 (twelve) hours for 14 days., Disp: 28 tablet, Rfl: 0 .  lisinopril (ZESTRIL) 10 MG tablet, Take 1 tablet (10 mg total) by mouth daily., Disp: 90 tablet, Rfl: 3 .  tamsulosin (FLOMAX) 0.4 MG CAPS capsule, Take 1 capsule (0.4 mg total) by mouth at bedtime. Prn for urinary symptoms, Disp: 30 capsule, Rfl: 0   No Known Allergies   Family History  Problem  Relation Age of Onset  . Diabetes Mother   . Obesity Mother   . Heart disease Father   . Leukemia Maternal Grandmother      Social History   Tobacco Use  . Smoking status: Never Smoker  . Smokeless tobacco: Current User    Types: Snuff  Substance Use Topics  . Alcohol use: Yes    Alcohol/week: 28.0 standard drinks    Types: 28 Shots of liquor per week  . Drug use: Never     Chart Review Today: I personally reviewed active problem list, medication list, allergies, family history, social history, health maintenance, notes from last encounter, lab results, imaging with the patient/caregiver today.   Review of Systems  Constitutional: Negative.   HENT: Negative.   Eyes: Negative.   Respiratory: Negative.   Cardiovascular: Negative.   Gastrointestinal: Positive for abdominal pain and nausea. Negative for abdominal distention, anal bleeding, blood in stool, constipation, diarrhea, rectal pain and vomiting.  Endocrine: Negative.   Genitourinary: Positive for difficulty urinating, dysuria, hematuria and penile pain. Negative for decreased urine volume, discharge, flank pain, genital sores, penile swelling and testicular pain.  Musculoskeletal: Negative.   Skin: Negative.   Allergic/Immunologic: Negative.   Neurological: Negative.  Negative for dizziness and headaches.  Hematological: Negative.   Psychiatric/Behavioral: Negative.   All other systems reviewed and are negative.      Objective:   Vitals:   10/23/19 1600  BP: (!) 144/94  Pulse: 81  Resp: 16  Temp: (!) 97.1 F (36.2 C)  TempSrc: Temporal  SpO2: 98%  Weight: 198 lb 4.8 oz (89.9 kg)  Height: 6\' 3"  (1.905 m)    Body mass index is 24.79 kg/m.  Physical Exam Vitals and nursing note reviewed.  Constitutional:      General: He is not in acute distress.    Appearance: Normal appearance. He is well-developed. He is not ill-appearing, toxic-appearing or diaphoretic.     Interventions: Face mask in place.      Comments: Well-appearing male no acute distress  HENT:     Head: Normocephalic and atraumatic.     Jaw: No trismus.  Eyes:     General: Lids are normal. No scleral icterus.    Conjunctiva/sclera: Conjunctivae normal.  Neck:     Trachea: Trachea and phonation normal. No tracheal deviation.  Cardiovascular:     Rate and Rhythm: Normal rate and regular rhythm.     Pulses: Normal pulses.          Radial pulses are 2+ on the right side and 2+ on the left side.       Posterior tibial pulses are 2+ on the right side and 2+ on the left side.     Heart sounds: Normal heart sounds. No murmur. No friction rub. No gallop.  Pulmonary:     Effort: Pulmonary effort is normal. No respiratory distress.     Breath sounds: Normal breath sounds. No stridor. No wheezing, rhonchi or rales.  Abdominal:     General: Abdomen is flat. Bowel sounds are normal. There is no distension.     Palpations: Abdomen is soft.     Tenderness: There is no abdominal tenderness. There is no right CVA tenderness, left CVA tenderness, guarding or rebound. Negative signs include Rovsing's sign and McBurney's sign.  Genitourinary:    Comments: Declines GU exam Musculoskeletal:        General: Normal range of motion.     Cervical back: Normal range of motion and neck supple.     Right lower leg: No edema.     Left lower leg: No edema.  Skin:    General: Skin is warm and dry.     Coloration: Skin is not jaundiced or pale.     Nails: There is no clubbing.  Neurological:     Mental Status: He is alert and oriented to person, place, and time.     Cranial Nerves: No dysarthria or facial asymmetry.     Motor: No tremor or abnormal muscle tone.     Coordination: Coordination normal.     Gait: Gait normal.  Psychiatric:        Mood and Affect: Mood normal.        Speech: Speech normal.        Behavior: Behavior normal. Behavior is cooperative.      Results for orders placed or performed in visit on 10/23/19  POCT  Urinalysis Dipstick  Result Value Ref Range   Color, UA yellow    Clarity, UA clear    Glucose, UA Negative Negative   Bilirubin, UA neg    Ketones, UA neg    Spec Grav, UA 1.020 1.010 - 1.025   Blood, UA positive    pH, UA 7.0 5.0 - 8.0   Protein, UA Negative Negative   Urobilinogen, UA 0.2 0.2 or 1.0 E.U./dL   Nitrite, UA neg    Leukocytes, UA Negative Negative   Appearance clear    Odor normal         Assessment & Plan:      ICD-10-CM   1. Dysuria  R30.0 POCT Urinalysis Dipstick    Urine Culture    CT RENAL STONE STUDY    tamsulosin (FLOMAX) 0.4 MG CAPS capsule    phenazopyridine (PYRIDIUM) 200 MG tablet   worsening after 2-3 weeks abx  2. Hematuria, unspecified type  R31.9 CT RENAL STONE STUDY    tamsulosin (FLOMAX) 0.4 MG CAPS capsule   UA today + blood, recent urine culture X 2 neg  3. Abdominal pain, unspecified abdominal location  R10.9 traMADol (ULTRAM) 50 MG tablet    ondansetron (ZOFRAN ODT) 4 MG disintegrating tablet   r/o kidney stone, pyelo, may be continued prostatitis? UTI? does not want STDs - low risk no GI sx, + hematuria, penile pain, suprapubic pain  4. Acute prostatitis  N41.0    hx of, start of his sx 2-3 weeks ago was just like past prostatitis, no improvement with abx  5. Penile pain  N48.89 POCT Urinalysis Dipstick    Urine Culture    tamsulosin (FLOMAX) 0.4 MG CAPS capsule    phenazopyridine (PYRIDIUM) 200 MG tablet   worsening severe constant pain, no discharge, low risk for STDs    Stat imaging ordered today while patient was in  clinic and toradol injection in clinic - currently waiting for insurance approval and scheduling   Hx of prostatitis with recent urinary sx that were just like prostatitis sx from when he was 32 Treated at UC with bactrim, switched to cipro, UA and culture neg x 2, worsening urinary sx, low abd pain, penile pain, hematuria with repeat dip, unsure if prostatitis vs  Nephrolithiasis -unlikely to be UTI with 2 separate  rounds of antibiotics in the last 2 to 3 weeks  CT renal stone study and repeated urine culture pending  Pt overall well appearing, non-toxic appearing, but constitutional sx are vague and generalized - malaise, fatigue No F, S, C, V/D - some nausea associated with worsening waves of pain.  No concerning GI symptoms, patient low risk for STIs and he did not want to do testing today or genital exam.    Discussed the plan with the patient today including treating pain even though he declined pain medication I did send through tramadol and Zofran, he did request Pyridium since it had helped him in the past, increase Flomax dose, continue antibiotics for now  Patient was in the exam room from about 4 PM to 5 PM today, after we discussed the plan and all the authorization was completed to get his CT scan scheduled it was scheduled today and patient was going to leave the office with his after visit summary with his plan and medications outlined.  He did request asked me question and Asher Muir brought him back to my office he did inquire about a lung nodule which was incidentally found on his chest x-ray.   He did review the imaging results on my chart, I had decided to not give him those results over the phone because he was going to return in 2 weeks to review his blood pressure and I was planning to review them in person with going over the chest x-ray and the images and discussed the follow-up.  We briefly discussed this today, and we will review the images when he returns next week for his hypertension follow-up.  Will refer over to Glenna Fellows for lung nodule team - pt notified.    Danelle Berry, PA-C 10/23/19 4:13 PM

## 2019-10-23 NOTE — Progress Notes (Signed)
Pts renal CT stone study (stat) resulted: I spoke with the Radiology tech and then with the pt from 6:10 pm to  6:20 PM   Ct was pertinent for left nephrolithiasis 12 mm stone at the left ureterovesical junction with mild left hydronephro ureter, no hydronephrosis or nephrolithiasis on the right Discussed the findings with the patient -previously prescribed medicines as noted below are treatment for nephrolithiasis, Toradol given in clinic, patient to take 0.8 mg Flomax in the evenings before bedtime, can use Pyridium, gave tramadol and Zofran, and put an urgent referral to urology I did encourage him to keep that follow-up appointment.  Hopefully he will be able to pass the stone, did discuss how it was fairly large and would like him to keep the follow-up in case he does not pass a stone or he requires procedure and he will already have established with urology.  Encouraged him to continue the Cipro until he sees urology and they can advise from there on discontinuing or completing course.  Patient does not have a history of kidney stones -he will likely be able to discuss further about this causes etc. with urology or with his follow-up visit here  Did discuss all of findings on the CT scan which included a incidental right kidney hypodense lesion, sigmoid diverticulosis without inflammatory changes, mild aortoiliac atherosclerotic disease which we discussed at length what that is and treatment Prostate was enlarged -  Dx added to chart - pt was encouraged to f/up with our office if he does not hear anything by Friday (end of week)  Pt has f/up next week for BP, so we do have close follow up    ICD-10-CM   1. Left nephrolithiasis  N20.0    see plan above  2. Dysuria  R30.0 POCT Urinalysis Dipstick    Urine Culture    CT RENAL STONE STUDY    tamsulosin (FLOMAX) 0.4 MG CAPS capsule    phenazopyridine (PYRIDIUM) 200 MG tablet    Ambulatory referral to Urology    ketorolac (TORADOL)  injection 60 mg   worsening after 2-3 weeks abx  3. Hematuria, unspecified type  R31.9 CT RENAL STONE STUDY    tamsulosin (FLOMAX) 0.4 MG CAPS capsule    Ambulatory referral to Urology   UA today + blood, recent urine culture X 2 neg  4. Abdominal pain, unspecified abdominal location  R10.9 traMADol (ULTRAM) 50 MG tablet    ondansetron (ZOFRAN ODT) 4 MG disintegrating tablet    Ambulatory referral to Urology    ketorolac (TORADOL) injection 60 mg   r/o kidney stone, pyelo, may be continued prostatitis? UTI? does not want STDs - low risk no GI sx, + hematuria, penile pain, suprapubic pain  5. Acute prostatitis  N41.0 Ambulatory referral to Urology    ketorolac (TORADOL) injection 60 mg   hx of, start of his sx 2-3 weeks ago was just like past prostatitis, no improvement with abx  6. Penile pain  N48.89 POCT Urinalysis Dipstick    Urine Culture    tamsulosin (FLOMAX) 0.4 MG CAPS capsule    phenazopyridine (PYRIDIUM) 200 MG tablet    ketorolac (TORADOL) injection 60 mg   worsening severe constant pain, no discharge, low risk for STDs  7. Sigmoid diverticulosis  K57.30    discussed briefly with pt  8. Enlarged prostate  N40.0    urology may be able to further evaluate - no hx of recent LUTS, only acute dysuria which pt suspected with prostatitis, may  have been kidney stone?  9. Nodule of left lung  R91.1    will discuss further at his f/up appt, will need the f/up chest CT  10. Lesion of right native kidney  N28.9    19mm lesion       He did ask me about lung nodule in his CXR and we briefly discussed it, when it looked like its size location and the recommended follow-up and I did explain to him that I wanted to discuss that with him in person so we had time to discuss it and I did not want to alarm him by having the nurse call and that his results he had reviewed on my chart.  All questions asked and answered today.  Additional 20 min this evening additional to his appt this  afternoon spent in pt's care.    Greater than 50% of this visit was spent in direct face-to-face counseling, obtaining history and physical, discussing and educating pt on treatment plan.  Total time of this visit was > 50 min.  Remainder of time involved but was not limited to reviewing chart (recent and pertinent OV notes and labs), documentation in EMR, and coordinating care and treatment plan.  Delsa Grana, PA-C 10/23/19 6:33 PM

## 2019-10-24 ENCOUNTER — Encounter: Payer: Self-pay | Admitting: Urology

## 2019-10-24 ENCOUNTER — Other Ambulatory Visit: Payer: Self-pay | Admitting: Urology

## 2019-10-24 ENCOUNTER — Ambulatory Visit (INDEPENDENT_AMBULATORY_CARE_PROVIDER_SITE_OTHER): Payer: BC Managed Care – PPO | Admitting: Urology

## 2019-10-24 ENCOUNTER — Ambulatory Visit: Payer: BC Managed Care – PPO | Admitting: Family Medicine

## 2019-10-24 ENCOUNTER — Other Ambulatory Visit
Admission: RE | Admit: 2019-10-24 | Discharge: 2019-10-24 | Disposition: A | Discharge status: Home / Self Care | Payer: BC Managed Care – PPO | Source: Ambulatory Visit | Attending: Urology | Admitting: Urology

## 2019-10-24 VITALS — BP 172/101 | HR 78 | Ht 75.0 in | Wt 179.0 lb

## 2019-10-24 DIAGNOSIS — Z01812 Encounter for preprocedural laboratory examination: Secondary | ICD-10-CM | POA: Insufficient documentation

## 2019-10-24 DIAGNOSIS — N200 Calculus of kidney: Secondary | ICD-10-CM

## 2019-10-24 DIAGNOSIS — Z20822 Contact with and (suspected) exposure to covid-19: Secondary | ICD-10-CM | POA: Insufficient documentation

## 2019-10-24 LAB — URINE CULTURE
MICRO NUMBER:: 10179921
Result:: NO GROWTH
SPECIMEN QUALITY:: ADEQUATE

## 2019-10-24 MED ORDER — SODIUM CHLORIDE (PF) 0.9 % IJ SOLN: 100.0000 mL | Freq: Once | INTRAMUSCULAR | Status: AC

## 2019-10-24 NOTE — H&P (View-Only) (Signed)
 10/24/2019 8:32 AM   Douglas Heath 08/31/1955 [id number]  Referring provider: Tapia, Leisa, PA-C [address] [place],  [place] [address]  Chief Complaint  Patient presents with  . Nephrolithiasis    HPI: Douglas Heath is a 64 yo M pt who presents today for the evaluation and management of kidney stones.   He presented to primary care with urinary symptoms (10/12/2019)  of dysuria, rectal pressure, hematuria diagnosed with presumed prostatitis. He was given a course of 7 days of bactrim w/o resolution of symptoms.   He obtained a CT yesterday showing a 12 mm UVJ stone with hydroureteronephrosis.  No other stone burden.  He reports of pain in his penis with left side a little worse.   He is currently taking cipro.  UA benign appearing.  He reports of visiting a Urologist in the summer and was informed of having Peyronie disease which he has not treated due to cost.   He described upward stable curvature without pain, stable curve.  No pain.  Able to penetrate without difficulty.    PMH: Past Medical History:  Diagnosis Date  . Hypertension      Surgical History: No past surgical history on file.  Home Medications:  Allergies as of 10/24/2019   No Known Allergies     Medication List       Accurate as of October 24, 2019 11:59 PM. If you have any questions, ask your nurse or doctor.        ciprofloxacin 500 MG tablet Commonly known as: CIPRO Take 500 mg by mouth 2 (two) times daily. What changed: Another medication with the same name was removed. Continue taking this medication, and follow the directions you see here. Changed by: Ezri Fanguy, MD   lisinopril 10 MG tablet Commonly known as: ZESTRIL Take 1 tablet (10 mg total) by mouth daily.   ondansetron 4 MG disintegrating tablet Commonly known as: Zofran ODT Take 1 tablet (4 mg total) by mouth every 8 (eight) hours as needed for nausea.   phenazopyridine 200 MG tablet Commonly known as: PYRIDIUM  Take 1 tablet (200 mg total) by mouth 3 (three) times daily.   tamsulosin 0.4 MG Caps capsule Commonly known as: FLOMAX Take 2 capsules (0.8 mg total) by mouth at bedtime.   traMADol 50 MG tablet Commonly known as: ULTRAM Take 1-2 tablets (50-100 mg total) by mouth 3 (three) times daily as needed for severe pain.       Allergies: No Known Allergies  Family History: Family History  Problem Relation Age of Onset  . Diabetes Mother   . Obesity Mother   . Heart disease Father   . Leukemia Maternal Grandmother     Social History:  reports that he has never smoked. His smokeless tobacco use includes snuff. He reports current alcohol use of about 28.0 standard drinks of alcohol per week. He reports that he does not use drugs.   Physical Exam: BP (!) 172/101   Pulse 78   Ht 6' 3" (1.905 m)   Wt 179 lb (81.2 kg)   BMI 22.37 kg/m   Constitutional:  Alert and oriented, No acute distress. HEENT: [place] AT, moist mucus membranes.  Trachea midline, no masses. Cardiovascular: No clubbing, cyanosis, or edema. Respiratory: Normal respiratory effort, no increased work of breathing. Skin: No rashes, bruises or suspicious lesions. Neurologic: Grossly intact, no focal deficits, moving all 4 extremities. Psychiatric: Normal mood and affect.  Laboratory Data: Lab Results  Component Value Date     WBC 4.0 10/11/2019   HGB 15.8 10/11/2019   HCT 44.5 10/11/2019   MCV 90.3 10/11/2019   PLT 168 10/11/2019    Lab Results  Component Value Date   CREATININE 1.13 10/11/2019   Urinalysis Results for orders placed or performed in visit on 10/23/19  Urine Culture   Specimen: Urine  Result Value Ref Range   MICRO NUMBER: [id number]    SPECIMEN QUALITY: Adequate    Sample Source URINE    STATUS: FINAL    Result: No Growth   POCT Urinalysis Dipstick  Result Value Ref Range   Color, UA yellow    Clarity, UA clear    Glucose, UA Negative Negative   Bilirubin, UA neg    Ketones, UA neg     Spec Grav, UA 1.020 1.010 - 1.025   Blood, UA positive    pH, UA 7.0 5.0 - 8.0   Protein, UA Negative Negative   Urobilinogen, UA 0.2 0.2 or 1.0 E.U./dL   Nitrite, UA neg    Leukocytes, UA Negative Negative   Appearance clear    Odor normal     Pertinent Imaging: Results for orders placed in visit on 10/23/19  CT RENAL STONE STUDY   Narrative CLINICAL DATA:  62-year-old male with hematuria.  EXAM: CT ABDOMEN AND PELVIS WITHOUT CONTRAST  TECHNIQUE: Multidetector CT imaging of the abdomen and pelvis was performed following the standard protocol without IV contrast.  COMPARISON:  None.  FINDINGS: Evaluation of this exam is limited in the absence of intravenous contrast.  Lower chest: The visualized lung bases are clear.  No intra-abdominal free air or free fluid.  Hepatobiliary: No focal liver abnormality is seen. No gallstones, gallbladder wall thickening, or biliary dilatation.  Pancreas: Unremarkable. No pancreatic ductal dilatation or surrounding inflammatory changes.  Spleen: Normal in size without focal abnormality.  Adrenals/Urinary Tract: The adrenal glands are unremarkable. There is a stone at the left ureterovesical junction which measures up to 12 mm in length. There is mild left hydro nephro ureter. There is no hydronephrosis or nephrolithiasis on the right. A 9 mm exophytic hypodense lesion from the medial inferior pole of the right kidney is not characterized but may represent a cyst. The right ureter is unremarkable. The urinary bladder is unremarkable as well.  Stomach/Bowel: There is sigmoid diverticulosis without active inflammatory changes. There is moderate stool throughout the colon. There is no bowel obstruction or active inflammation. The appendix is normal.  Vascular/Lymphatic: Mild aortoiliac atherosclerotic disease. The IVC is unremarkable. No portal venous gas. There is no adenopathy.  Reproductive: Enlarged prostate gland measuring 6  cm in transverse axial diameter. The seminal vesicles are symmetric.  Other: Metallic coils in the scrotum likely related to bursectomy.  Musculoskeletal: Mild degenerative changes of the spine. No acute osseous pathology.  IMPRESSION: 1. A 12 mm left UVJ stone with mild left hydronephrosis. 2. Sigmoid diverticulosis. No bowel obstruction or active inflammation. Normal appendix.   Electronically Signed   By: Arash  Radparvar M.D.   On: 10/23/2019 18:01    I have personally reviewed radiological imaging and noted 12 mm left UVJ stone with mild left hydronephrosis.   Assessment & Plan:    1. Left distal urereal calculus  We discussed various treatment options including ESWL vs. ureteroscopy, laser lithotripsy, and stent. We discussed the risks and benefits of both including bleeding, infection, damage to surrounding structures, efficacy with need for possible further intervention, and need for temporary ureteral stent. Given size of stone and   duration, unlikely he'll pass it spontaneously  Continue Flomax  Pt elected for Lithotripsy Warning symptoms reviewed  2.Left hydroureteronephrosis See number 1   3. Dysuria  Irritated urinary symptoms related to proximal stone close to bladder  No concern for infection   4. Gross Hematuria  Likely secondary to stone will recheck when stone resolves  5. Peyronie's Disease Pathophysiology was discussed at length including the 2 phases of the diease, acute and chronic.  We discussed that this medication is administered in cycles which include 2 injections of the medication into the penile plaque followed by modeling procedure with 6 weeks of home exercises. Goals of treatment were reviewed which include improvement of penile curvature ideally to facilitate sexual function/penetration but likely not complete resolution of the curvature. Risks including failure of the medication, penile hematoma/edema, pain, and penile fracture were all  discussed. All of his questions were answered.  Pt not interest in intervention at this time   [facility] [address], [address] [place], [place] [address] [phone]  I, Nethusan Sivanesan, am acting as a scribe for Dr. Elora Wolter,  Meranda Dechaine, MD   

## 2019-10-24 NOTE — Progress Notes (Signed)
10/24/2019 8:32 AM   Douglas Heath [birth year] [id number]  Referring provider: Delsa Grana, PA-C [address] [place],  [place] [address]  Chief Complaint  Patient presents with  . Nephrolithiasis    HPI: Douglas Heath is a 64 yo M pt who presents today for the evaluation and management of kidney stones.   He presented to primary care with urinary symptoms (10/12/2019)  of dysuria, rectal pressure, hematuria diagnosed with presumed prostatitis. He was given a course of 7 days of bactrim w/o resolution of symptoms.   He obtained a CT yesterday showing a 12 mm UVJ stone with hydroureteronephrosis.  No other stone burden.  He reports of pain in his penis with left side a little worse.   He is currently taking cipro.  UA benign appearing.  He reports of visiting a Urologist in the summer and was informed of having Peyronie disease which he has not treated due to cost.   He described upward stable curvature without pain, stable curve.  No pain.  Able to penetrate without difficulty.    PMH: Past Medical History:  Diagnosis Date  . Hypertension      Surgical History: No past surgical history on file.  Home Medications:  Allergies as of 10/24/2019   No Known Allergies     Medication List       Accurate as of October 24, 2019 11:59 PM. If you have any questions, ask your nurse or doctor.        ciprofloxacin 500 MG tablet Commonly known as: CIPRO Take 500 mg by mouth 2 (two) times daily. What changed: Another medication with the same name was removed. Continue taking this medication, and follow the directions you see here. Changed by: Hollice Espy, MD   lisinopril 10 MG tablet Commonly known as: ZESTRIL Take 1 tablet (10 mg total) by mouth daily.   ondansetron 4 MG disintegrating tablet Commonly known as: Zofran ODT Take 1 tablet (4 mg total) by mouth every 8 (eight) hours as needed for nausea.   phenazopyridine 200 MG tablet Commonly known as: PYRIDIUM  Take 1 tablet (200 mg total) by mouth 3 (three) times daily.   tamsulosin 0.4 MG Caps capsule Commonly known as: FLOMAX Take 2 capsules (0.8 mg total) by mouth at bedtime.   traMADol 50 MG tablet Commonly known as: ULTRAM Take 1-2 tablets (50-100 mg total) by mouth 3 (three) times daily as needed for severe pain.       Allergies: No Known Allergies  Family History: Family History  Problem Relation Age of Onset  . Diabetes Mother   . Obesity Mother   . Heart disease Father   . Leukemia Maternal Grandmother     Social History:  reports that he has never smoked. His smokeless tobacco use includes snuff. He reports current alcohol use of about 28.0 standard drinks of alcohol per week. He reports that he does not use drugs.   Physical Exam: BP (!) 172/101   Pulse 78   Ht 6\' 3"  (1.905 m)   Wt 179 lb (81.2 kg)   BMI 22.37 kg/m   Constitutional:  Alert and oriented, No acute distress. HEENT: [place] AT, moist mucus membranes.  Trachea midline, no masses. Cardiovascular: No clubbing, cyanosis, or edema. Respiratory: Normal respiratory effort, no increased work of breathing. Skin: No rashes, bruises or suspicious lesions. Neurologic: Grossly intact, no focal deficits, moving all 4 extremities. Psychiatric: Normal mood and affect.  Laboratory Data: Lab Results  Component Value Date  WBC 4.0 10/11/2019   HGB 15.8 10/11/2019   HCT 44.5 10/11/2019   MCV 90.3 10/11/2019   PLT 168 10/11/2019    Lab Results  Component Value Date   CREATININE 1.13 10/11/2019   Urinalysis Results for orders placed or performed in visit on 10/23/19  Urine Culture   Specimen: Urine  Result Value Ref Range   MICRO NUMBER: [id number]    SPECIMEN QUALITY: Adequate    Sample Source URINE    STATUS: FINAL    Result: No Growth   POCT Urinalysis Dipstick  Result Value Ref Range   Color, UA yellow    Clarity, UA clear    Glucose, UA Negative Negative   Bilirubin, UA neg    Ketones, UA neg     Spec Grav, UA 1.020 1.010 - 1.025   Blood, UA positive    pH, UA 7.0 5.0 - 8.0   Protein, UA Negative Negative   Urobilinogen, UA 0.2 0.2 or 1.0 E.U./dL   Nitrite, UA neg    Leukocytes, UA Negative Negative   Appearance clear    Odor normal     Pertinent Imaging: Results for orders placed in visit on 10/23/19  CT RENAL STONE STUDY   Narrative CLINICAL DATA:  64 year old male with hematuria.  EXAM: CT ABDOMEN AND PELVIS WITHOUT CONTRAST  TECHNIQUE: Multidetector CT imaging of the abdomen and pelvis was performed following the standard protocol without IV contrast.  COMPARISON:  None.  FINDINGS: Evaluation of this exam is limited in the absence of intravenous contrast.  Lower chest: The visualized lung bases are clear.  No intra-abdominal free air or free fluid.  Hepatobiliary: No focal liver abnormality is seen. No gallstones, gallbladder wall thickening, or biliary dilatation.  Pancreas: Unremarkable. No pancreatic ductal dilatation or surrounding inflammatory changes.  Spleen: Normal in size without focal abnormality.  Adrenals/Urinary Tract: The adrenal glands are unremarkable. There is a stone at the left ureterovesical junction which measures up to 12 mm in length. There is mild left hydro nephro ureter. There is no hydronephrosis or nephrolithiasis on the right. A 9 mm exophytic hypodense lesion from the medial inferior pole of the right kidney is not characterized but may represent a cyst. The right ureter is unremarkable. The urinary bladder is unremarkable as well.  Stomach/Bowel: There is sigmoid diverticulosis without active inflammatory changes. There is moderate stool throughout the colon. There is no bowel obstruction or active inflammation. The appendix is normal.  Vascular/Lymphatic: Mild aortoiliac atherosclerotic disease. The IVC is unremarkable. No portal venous gas. There is no adenopathy.  Reproductive: Enlarged prostate gland measuring 6  cm in transverse axial diameter. The seminal vesicles are symmetric.  Other: Metallic coils in the scrotum likely related to bursectomy.  Musculoskeletal: Mild degenerative changes of the spine. No acute osseous pathology.  IMPRESSION: 1. A 12 mm left UVJ stone with mild left hydronephrosis. 2. Sigmoid diverticulosis. No bowel obstruction or active inflammation. Normal appendix.   Electronically Signed   By: Elgie Collard M.D.   On: 10/23/2019 18:01    I have personally reviewed radiological imaging and noted 12 mm left UVJ stone with mild left hydronephrosis.   Assessment & Plan:    1. Left distal urereal calculus  We discussed various treatment options including ESWL vs. ureteroscopy, laser lithotripsy, and stent. We discussed the risks and benefits of both including bleeding, infection, damage to surrounding structures, efficacy with need for possible further intervention, and need for temporary ureteral stent. Given size of stone and  duration, unlikely he'll pass it spontaneously  Continue Flomax  Pt elected for Lithotripsy Warning symptoms reviewed  2.Left hydroureteronephrosis See number 1   3. Dysuria  Irritated urinary symptoms related to proximal stone close to bladder  No concern for infection   4. Gross Hematuria  Likely secondary to stone will recheck when stone resolves  5. Peyronie's Disease Pathophysiology was discussed at length including the 2 phases of the diease, acute and chronic.  We discussed that this medication is administered in cycles which include 2 injections of the medication into the penile plaque followed by modeling procedure with 6 weeks of home exercises. Goals of treatment were reviewed which include improvement of penile curvature ideally to facilitate sexual function/penetration but likely not complete resolution of the curvature. Risks including failure of the medication, penile hematoma/edema, pain, and penile fracture were all  discussed. All of his questions were answered.  Pt not interest in intervention at this time   [facility] [address], [address] [place], [place] [address] [phone]  I, Donne Hazel, am acting as a scribe for Dr. [name],  [name], MD

## 2019-10-25 ENCOUNTER — Telehealth: Payer: Self-pay

## 2019-10-25 ENCOUNTER — Encounter: Payer: Self-pay | Admitting: Urology

## 2019-10-25 ENCOUNTER — Encounter
Admission: RE | Disposition: A | Discharge status: Home / Self Care | Payer: Self-pay | Source: Home / Self Care | Attending: Urology

## 2019-10-25 ENCOUNTER — Ambulatory Visit: Payer: BC Managed Care – PPO

## 2019-10-25 ENCOUNTER — Other Ambulatory Visit: Payer: Self-pay

## 2019-10-25 ENCOUNTER — Ambulatory Visit
Admission: RE | Admit: 2019-10-25 | Discharge: 2019-10-25 | Disposition: A | Discharge status: Home / Self Care | Payer: BC Managed Care – PPO | Attending: Urology | Admitting: Urology

## 2019-10-25 DIAGNOSIS — R300 Dysuria: Secondary | ICD-10-CM

## 2019-10-25 DIAGNOSIS — N132 Hydronephrosis with renal and ureteral calculous obstruction: Secondary | ICD-10-CM | POA: Diagnosis not present

## 2019-10-25 DIAGNOSIS — N4889 Other specified disorders of penis: Secondary | ICD-10-CM

## 2019-10-25 DIAGNOSIS — N201 Calculus of ureter: Secondary | ICD-10-CM | POA: Diagnosis not present

## 2019-10-25 DIAGNOSIS — I10 Essential (primary) hypertension: Secondary | ICD-10-CM | POA: Diagnosis not present

## 2019-10-25 DIAGNOSIS — N200 Calculus of kidney: Secondary | ICD-10-CM

## 2019-10-25 HISTORY — PX: EXTRACORPOREAL SHOCK WAVE LITHOTRIPSY: SHX1557

## 2019-10-25 HISTORY — DX: Essential (primary) hypertension: I10

## 2019-10-25 LAB — URINALYSIS, COMPLETE
Bilirubin, UA: NEGATIVE
Glucose, UA: NEGATIVE
Ketones, UA: NEGATIVE
Leukocytes,UA: NEGATIVE
Nitrite, UA: NEGATIVE
Protein,UA: NEGATIVE
Specific Gravity, UA: 1.01 (ref 1.005–1.030)
Urobilinogen, Ur: 0.2 mg/dL (ref 0.2–1.0)
pH, UA: 7 (ref 5.0–7.5)

## 2019-10-25 LAB — MICROSCOPIC EXAMINATION
Bacteria, UA: NONE SEEN
RBC, Urine: NONE SEEN /hpf (ref 0–2)

## 2019-10-25 LAB — SARS CORONAVIRUS 2 (TAT 6-24 HRS): SARS Coronavirus 2: NEGATIVE

## 2019-10-25 SURGERY — LITHOTRIPSY, ESWL
Anesthesia: Moderate Sedation | Laterality: Left

## 2019-10-25 MED ORDER — ONDANSETRON HCL 4 MG/2ML IJ SOLN
INTRAMUSCULAR | Status: AC
  Administered 2019-10-25: 09:00:00 4 mg via INTRAVENOUS
  Filled 2019-10-25: qty 2

## 2019-10-25 MED ORDER — KETOROLAC TROMETHAMINE 30 MG/ML IJ SOLN
INTRAMUSCULAR | Status: AC
  Filled 2019-10-25: qty 1

## 2019-10-25 MED ORDER — DIPHENHYDRAMINE HCL 25 MG PO CAPS
ORAL_CAPSULE | ORAL | Status: AC
  Administered 2019-10-25: 08:00:00 25 mg via ORAL
  Filled 2019-10-25: qty 1

## 2019-10-25 MED ORDER — ONDANSETRON HCL 4 MG/2ML IJ SOLN: 4.0000 mg | Freq: Once | INTRAMUSCULAR | Status: AC

## 2019-10-25 MED ORDER — DIAZEPAM 5 MG PO TABS: 10.0000 mg | ORAL_TABLET | ORAL | Status: AC

## 2019-10-25 MED ORDER — DIPHENHYDRAMINE HCL 25 MG PO CAPS: 25.0000 mg | ORAL_CAPSULE | ORAL | Status: AC

## 2019-10-25 MED ORDER — KETOROLAC TROMETHAMINE 30 MG/ML IJ SOLN
30.0000 mg | Freq: Once | INTRAMUSCULAR | Status: AC
  Administered 2019-10-25: 11:00:00 30 mg via INTRAVENOUS

## 2019-10-25 MED ORDER — DIAZEPAM 5 MG PO TABS
ORAL_TABLET | ORAL | Status: AC
  Administered 2019-10-25: 08:00:00 10 mg via ORAL
  Filled 2019-10-25: qty 2

## 2019-10-25 MED ORDER — PHENAZOPYRIDINE HCL 200 MG PO TABS: 200.0000 mg | ORAL_TABLET | Freq: Three times a day (TID) | ORAL | 0 refills | Status: DC

## 2019-10-25 NOTE — Discharge Instructions (Signed)
AMBULATORY SURGERY  DISCHARGE INSTRUCTIONS   1) The drugs that you were given will stay in your system until tomorrow so for the next 24 hours you should not:  A) Drive an automobile B) Make any legal decisions C) Drink any alcoholic beverage   2) You may resume regular meals tomorrow.  Today it is better to start with liquids and gradually work up to solid foods.  You may eat anything you prefer, but it is better to start with liquids, then soup and crackers, and gradually work up to solid foods.   3) Please notify your doctor immediately if you have any unusual bleeding, trouble breathing, redness and pain at the surgery site, drainage, fever, or pain not relieved by medication.    4) Additional Instructions:   Please contact your physician with any problems or Same Day Surgery at [phone], Monday through Friday 6 am to 4 pm, or [facility] [place] Main number at [phone].See [facility] discharge instructions in chart.  

## 2019-10-25 NOTE — Telephone Encounter (Signed)
Patient called today post ESWL having dysuria with urination. Patient was encouraged to increase water intake and utilize pyridium given. Patient only has one left, refill was sent to pharmacy. Patient denies fever, chills, nausea or vomiting

## 2019-10-25 NOTE — Interval H&P Note (Signed)
History and Physical Interval Note:  10/25/2019 9:06 AM  Douglas Heath  has presented today for surgery, with the diagnosis of Kidney stone.  The various methods of treatment have been discussed with the patient and family. After consideration of risks, benefits and other options for treatment, the patient has consented to  Procedure(s): EXTRACORPOREAL SHOCK WAVE LITHOTRIPSY (ESWL) (Left) as a surgical intervention.  The patient's history has been reviewed, patient examined, no change in status, stable for surgery.  I have reviewed the patient's chart and labs.  Questions were answered to the patient's satisfaction.     [name]

## 2019-10-31 ENCOUNTER — Ambulatory Visit: Payer: BC Managed Care – PPO | Admitting: Family Medicine

## 2019-10-31 ENCOUNTER — Other Ambulatory Visit: Payer: Self-pay

## 2019-10-31 ENCOUNTER — Encounter: Payer: Self-pay | Admitting: Family Medicine

## 2019-10-31 VITALS — BP 140/82 | HR 88 | Temp 98.3°F | Resp 14 | Ht 75.0 in | Wt 202.0 lb

## 2019-10-31 DIAGNOSIS — I10 Essential (primary) hypertension: Secondary | ICD-10-CM

## 2019-10-31 DIAGNOSIS — R911 Solitary pulmonary nodule: Secondary | ICD-10-CM

## 2019-10-31 DIAGNOSIS — E785 Hyperlipidemia, unspecified: Secondary | ICD-10-CM

## 2019-10-31 NOTE — Patient Instructions (Signed)
The 10-year ASCVD risk score Denman George DC Montez Hageman., et al., 2013) is: 12.1%   Values used to calculate the score:     Age: 64 years     Sex: Male     Is Non-Hispanic African American: No     Diabetic: No     Tobacco smoker: No     Systolic Blood Pressure: 140 mmHg     Is BP treated: Yes     HDL Cholesterol: 60 mg/dL     Total Cholesterol: 198 mg/dL   Can look up american heart association cholesterol and risk calculator  With a little lower blood pressure and little lower cholesterol you are lower risk  Start statins for LDL >160 Risk score > 7.5% Or other strong family history

## 2019-10-31 NOTE — Progress Notes (Signed)
Patient ID: Douglas Heath, male    DOB: 05-14-1956, 64 y.o.   MRN: [id number]  PCP: Delsa Grana, PA-C  Chief Complaint  Patient presents with  . Follow-up  . Hypertension  . abnromal chest xray    nodule    Subjective:   Douglas Heath is a 64 y.o. male, presents to clinic with CC of the following:  Pt here for blood pressure recheck. Pt new to est with high bp at initial appt, found to have kidney stone shortly afterwards he did endorse severe pain and blood pressure was elevated for several of those visits, his kidney stone has been treated by urology and he has passed the stone and he is feeling much improved, previously blood pressure was noted to be much higher, today is slightly closer to goal.  BP Readings from Last 10 Encounters:  10/31/19 140/82  10/25/19 113/73  10/24/19 (!) 172/101  10/23/19 (!) 144/94  10/11/19 (!) 146/86   Meds were changed to lisinopril 10 mg daily and he has been working on diet lifestyle changes including cutting back on dip, alcohol, improving exercise and his diet Taking meds as directed w/o problems, reports he is compliant with med changes. Pt denies chest pain, SOB, dizziness, or heart palpitations.  Denies medication side effects.   CXR showed lung nodule- reviewed images and f/up CT today at length.  Labs done previously show HLD Lab Results  Component Value Date   CHOL 198 10/11/2019   HDL 60 10/11/2019   LDLCALC 122 (H) 10/11/2019   TRIG 70 10/11/2019   CHOLHDL 3.3 10/11/2019  Discussed CV risk, efforts to improve LDL and decrease 10 year risk score.  We even reviewed the risk calculator and explored how if he can decrease his cholesterol about 20-30 points, decrease his blood pressure to a optimal range that his risk will dropped about 7% so he may be able to avoid the medical recommendation of statins if he is able to improve some of his labs and his lifestyle habits.  Patient prefers to not start any medication and will recheck several  months from now  The 10-year ASCVD risk score Mikey Bussing DC Brooke Bonito., et al., 2013) is: 12.1%   Values used to calculate the score:     Age: 14 years     Sex: Male     Is Non-Hispanic African American: No     Diabetic: No     Tobacco smoker: No     Systolic Blood Pressure: 694 mmHg     Is BP treated: Yes     HDL Cholesterol: 60 mg/dL     Total Cholesterol: 198 mg/dL   Patient Active Problem List   Diagnosis Date Noted  . Nodule of left lung 10/23/2019  . Enlarged prostate 10/23/2019  . Left nephrolithiasis 10/23/2019  . Sigmoid diverticulosis 10/23/2019  . Hypertension 10/15/2019  . Family history of cardiac disorder in father 10/15/2019      Current Outpatient Medications:  .  lisinopril (ZESTRIL) 10 MG tablet, Take 1 tablet (10 mg total) by mouth daily., Disp: 90 tablet, Rfl: 3 .  tamsulosin (FLOMAX) 0.4 MG CAPS capsule, Take 2 capsules (0.8 mg total) by mouth at bedtime., Disp: 30 capsule, Rfl: 0 .  ciprofloxacin (CIPRO) 500 MG tablet, Take 500 mg by mouth 2 (two) times daily., Disp: , Rfl:  .  ondansetron (ZOFRAN ODT) 4 MG disintegrating tablet, Take 1 tablet (4 mg total) by mouth every 8 (eight) hours as needed for nausea. (Patient  not taking: Reported on 10/31/2019), Disp: 10 tablet, Rfl: 0 .  phenazopyridine (PYRIDIUM) 200 MG tablet, Take 1 tablet (200 mg total) by mouth 3 (three) times daily. (Patient not taking: Reported on 10/31/2019), Disp: 6 tablet, Rfl: 0 .  traMADol (ULTRAM) 50 MG tablet, Take 1-2 tablets (50-100 mg total) by mouth 3 (three) times daily as needed for severe pain. (Patient not taking: Reported on 10/31/2019), Disp: 20 tablet, Rfl: 0  Current Facility-Administered Medications:  .  sodium chloride (PF) 0.9 % injection 100 mL, 100 mL, Intravenous, Once, [name], MD   No Known Allergies   Family History  Problem Relation Age of Onset  . Diabetes Mother   . Obesity Mother   . Heart disease Father   . Leukemia Maternal Grandmother      Social History    Socioeconomic History  . Marital status: Married    Spouse name: Douglas Heath  . Number of children: 2  . Years of education: 77  . Highest education level: Bachelor's degree (e.g., BA, AB, BS)  Occupational History  . Not on file  Tobacco Use  . Smoking status: Never Smoker  . Smokeless tobacco: Current User    Types: Snuff  Substance and Sexual Activity  . Alcohol use: Yes    Alcohol/week: 28.0 standard drinks    Types: 28 Shots of liquor per week  . Drug use: Never  . Sexual activity: Yes  Other Topics Concern  . Not on file  Social History Narrative  . Not on file   Social Determinants of Health   Financial Resource Strain:   . Difficulty of Paying Living Expenses: Not on file  Food Insecurity:   . Worried About Programme researcher, broadcasting/film/video in the Last Year: Not on file  . Ran Out of Food in the Last Year: Not on file  Transportation Needs:   . Lack of Transportation (Medical): Not on file  . Lack of Transportation (Non-Medical): Not on file  Physical Activity:   . Days of Exercise per Week: Not on file  . Minutes of Exercise per Session: Not on file  Stress:   . Feeling of Stress : Not on file  Social Connections:   . Frequency of Communication with Friends and Family: Not on file  . Frequency of Social Gatherings with Friends and Family: Not on file  . Attends Religious Services: Not on file  . Active Member of Clubs or Organizations: Not on file  . Attends Banker Meetings: Not on file  . Marital Status: Not on file  Intimate Partner Violence:   . Fear of Current or Ex-Partner: Not on file  . Emotionally Abused: Not on file  . Physically Abused: Not on file  . Sexually Abused: Not on file    Chart Review Today: I personally reviewed active problem list, medication list, allergies, family history, social history, health maintenance, notes from last encounter, lab results, imaging with the patient/caregiver today.   Review of Systems 10 Systems reviewed  and are negative for acute change except as noted in the HPI.     Objective:   Vitals:   10/31/19 1057  BP: 140/82  Pulse: 88  Resp: 14  Temp: 98.3 F (36.8 C)  SpO2: 97%  Weight: 202 lb (91.6 kg)  Height: 6\' 3"  (1.905 m)    Body mass index is 25.25 kg/m.  Physical Exam Vitals and nursing note reviewed.  Constitutional:      General: He is not in acute  distress.    Appearance: Normal appearance. He is well-developed. He is not ill-appearing, toxic-appearing or diaphoretic.     Interventions: Face mask in place.  HENT:     Head: Normocephalic and atraumatic.     Jaw: No trismus.     Right Ear: External ear normal.     Left Ear: External ear normal.  Eyes:     General: Lids are normal. No scleral icterus.    Conjunctiva/sclera: Conjunctivae normal.     Pupils: Pupils are equal, round, and reactive to light.  Neck:     Trachea: Trachea and phonation normal. No tracheal deviation.  Cardiovascular:     Rate and Rhythm: Normal rate and regular rhythm.     Pulses: Normal pulses.          Radial pulses are 2+ on the right side and 2+ on the left side.       Posterior tibial pulses are 2+ on the right side and 2+ on the left side.     Heart sounds: Normal heart sounds. No murmur. No friction rub. No gallop.   Pulmonary:     Effort: Pulmonary effort is normal. No respiratory distress.     Breath sounds: Normal breath sounds. No stridor. No wheezing, rhonchi or rales.  Abdominal:     General: Bowel sounds are normal. There is no distension.     Palpations: Abdomen is soft.     Tenderness: There is no abdominal tenderness. There is no guarding or rebound.  Musculoskeletal:        General: Normal range of motion.     Cervical back: Normal range of motion and neck supple.     Right lower leg: No edema.     Left lower leg: No edema.  Skin:    General: Skin is warm and dry.     Capillary Refill: Capillary refill takes less than 2 seconds.     Coloration: Skin is not  jaundiced.     Findings: No rash.     Nails: There is no clubbing.  Neurological:     Mental Status: He is alert.     Cranial Nerves: No dysarthria or facial asymmetry.     Motor: No tremor or abnormal muscle tone.     Gait: Gait normal.  Psychiatric:        Mood and Affect: Mood normal.        Speech: Speech normal.        Behavior: Behavior normal. Behavior is cooperative.      Results for orders placed or performed during the hospital encounter of 10/24/19  SARS CORONAVIRUS 2 (TAT 6-24 HRS) Nasopharyngeal Nasopharyngeal Swab   Specimen: Nasopharyngeal Swab  Result Value Ref Range   SARS Coronavirus 2 NEGATIVE NEGATIVE        Assessment & Plan:      ICD-10-CM   1. Hypertension, unspecified type  I10 COMPLETE METABOLIC PANEL WITH GFR   improving with lisinopril, goal of 130/80, will continue med and diet/lifestyle changes, increase exercise  2. Nodule of left lung  R91.1    will proceed with f/up chest CT  3. Hyperlipidemia, unspecified hyperlipidemia type  E78.5 COMPLETE METABOLIC PANEL WITH GFR    Lipid panel   discussed ASCVD risk score and benefits of statins, handouts given. Gave medical recommendation of starting statin, but pt prefers to work on lifestyle changes  - reasonable with how his risk is modifiable to below 7% - will recheck labs and reassess risk  Danelle Berry, PA-C 10/31/19 11:20 AM

## 2019-11-01 ENCOUNTER — Other Ambulatory Visit: Payer: Self-pay | Admitting: Oncology

## 2019-11-01 DIAGNOSIS — R911 Solitary pulmonary nodule: Secondary | ICD-10-CM

## 2019-11-01 NOTE — Progress Notes (Signed)
  Pulmonary Nodule Clinic Telephone Note [facility]   Received referral from Danelle Berry, NP.  HPI: Patient was recently had a chest x-ray for high blood pressure.  Imaging revealed a 0.9 cm nodule in left upper lung.  It was recommended he have a nonemergent chest CT for further evaluation.  Minimal scarring and atelectasis was noted in the left lung base.  Review and Recommendations: I personally reviewed all patient's previous imaging including chest x-ray from 10/11/2019 and 10/23/2019.  I recommend follow-up with noncontrasted CT scan of the chest ASAP for further evaluation.  Social History: Patient is not a current smoker.  He used snuff.  Unclear of quit date.  High risk factors include: History of heavy smoking, exposure to asbestos, radium or uranium, personal family history of lung cancer, older age, sex (females greater than males), race (black and native [place] greater than weight), marginal speculation, upper lobe location, multiplicity (less than 5 nodules increases risk for malignancy) and emphysema and/or pulmonary fibrosis.   This recommendation follows the consensus statement: Guidelines for Management of Incidental Pulmonary Nodules Detected on CT Images: From the Fleischner Society 2017; Radiology 2017; 284:228-243.    I have placed order for CT scan without contrast to be completed approximately 1 week.  Disposition: Order placed for repeat CT chest. Will notify Micael Hampshire in scheduling. Hayley Rhode to call patient with appointment date and time. Return to pulmonary nodule clinic a few days after his repeat imaging to discuss results and plan moving forward.  Durenda Hurt, NP 11/01/2019 11:23 AM

## 2019-11-02 ENCOUNTER — Telehealth: Payer: Self-pay | Admitting: *Deleted

## 2019-11-02 NOTE — Telephone Encounter (Signed)
Pt made aware of referral to Lung Nodule Clinic from PCP. Pt is in agreement to have upcoming CT scan and follow up as recommended.  Pt has been made aware of upcoming appts for follow up CT scan and follow up appt with Jennifer Burns, NP in the Lung Nodule Clinic. Pt verbalized understanding. Nothing further needed at this time.  

## 2019-11-05 ENCOUNTER — Other Ambulatory Visit: Payer: Self-pay | Admitting: Radiology

## 2019-11-05 DIAGNOSIS — N200 Calculus of kidney: Secondary | ICD-10-CM

## 2019-11-07 ENCOUNTER — Ambulatory Visit: Payer: BC Managed Care – PPO | Admitting: Urology

## 2019-11-08 ENCOUNTER — Ambulatory Visit
Admission: RE | Admit: 2019-11-08 | Discharge: 2019-11-08 | Disposition: A | Discharge status: Home / Self Care | Payer: BC Managed Care – PPO | Attending: Urology | Admitting: Urology

## 2019-11-08 ENCOUNTER — Encounter: Payer: Self-pay | Admitting: Urology

## 2019-11-08 ENCOUNTER — Ambulatory Visit
Admission: RE | Admit: 2019-11-08 | Discharge: 2019-11-08 | Disposition: A | Discharge status: Home / Self Care | Payer: BC Managed Care – PPO | Source: Ambulatory Visit | Attending: Urology | Admitting: Urology

## 2019-11-08 ENCOUNTER — Ambulatory Visit: Payer: BC Managed Care – PPO | Admitting: Urology

## 2019-11-08 ENCOUNTER — Other Ambulatory Visit: Payer: Self-pay

## 2019-11-08 VITALS — BP 157/98 | HR 59 | Ht 75.0 in | Wt 200.0 lb

## 2019-11-08 DIAGNOSIS — N200 Calculus of kidney: Secondary | ICD-10-CM | POA: Insufficient documentation

## 2019-11-08 DIAGNOSIS — N201 Calculus of ureter: Secondary | ICD-10-CM | POA: Diagnosis not present

## 2019-11-08 DIAGNOSIS — R310 Gross hematuria: Secondary | ICD-10-CM

## 2019-11-08 DIAGNOSIS — N132 Hydronephrosis with renal and ureteral calculous obstruction: Secondary | ICD-10-CM

## 2019-11-08 NOTE — Progress Notes (Signed)
11/08/2019 4:00 PM   Douglas Heath 31-Aug-1955 [id number]  Referring provider: Delsa Grana, PA-C [address] [place],  [place] [address]  Chief Complaint  Patient presents with  . Nephrolithiasis    HPI: Mr. Douglas Heath is a 64 year old male with a left distal ureteral calculus, left hydronephrosis, dysuria, gross hematuria and Peyronie's Disease.    CT Renal Stone study 10/23/2019 showing a 12 mm UVJ stone with hydroureteronephrosis.  No other stone burden.  KUB 10/25/2019 12 x 4 mm calculus at LEFT ureterovesical junction, unchanged from recent CT.    He underwent left ESWL for this stone with Dr. Erlene Quan on 10/25/2019.  His postprocedural course was as expected and uneventful.  He has passed fragments and has collected them and brings them in today.  Today, he feels well.  Patient denies any modifying or aggravating factors.  Patient denies any gross hematuria, dysuria or suprapubic/flank pain.  Patient denies any fevers, chills, nausea or vomiting.   KUB 11/08/2019 previously seen left UVJ stone is no longer present. No urinary tract stones are identified.    PMH: Past Medical History:  Diagnosis Date  . Hypertension     Surgical History: Past Surgical History:  Procedure Laterality Date  . EXTRACORPOREAL SHOCK WAVE LITHOTRIPSY Left 10/25/2019   Procedure: EXTRACORPOREAL SHOCK WAVE LITHOTRIPSY (ESWL);  Surgeon: Hollice Espy, MD;  Location: [facility] ORS;  Service: Urology;  Laterality: Left;    Home Medications:  Allergies as of 11/08/2019   No Known Allergies     Medication List       Accurate as of November 08, 2019  4:00 PM. If you have any questions, ask your nurse or doctor.        STOP taking these medications   ciprofloxacin 500 MG tablet Commonly known as: CIPRO Stopped by: Aspasia Rude, PA-C   ondansetron 4 MG disintegrating tablet Commonly known as: Zofran ODT Stopped by: Jairen Goldfarb, PA-C   phenazopyridine 200 MG tablet Commonly  known as: PYRIDIUM Stopped by: Linwood Gullikson, PA-C   tamsulosin 0.4 MG Caps capsule Commonly known as: FLOMAX Stopped by: Aljean Horiuchi, PA-C   traMADol 50 MG tablet Commonly known as: ULTRAM Stopped by: Zara Council, PA-C     TAKE these medications   lisinopril 10 MG tablet Commonly known as: ZESTRIL Take 1 tablet (10 mg total) by mouth daily.       Allergies: No Known Allergies  Family History: Family History  Problem Relation Age of Onset  . Diabetes Mother   . Obesity Mother   . Heart disease Father   . Leukemia Maternal Grandmother     Social History:  reports that he has never smoked. His smokeless tobacco use includes snuff. He reports current alcohol use of about 28.0 standard drinks of alcohol per week. He reports that he does not use drugs.  ROS: Pertinent ROS in HPI  Physical Exam: BP (!) 157/98   Pulse (!) 59   Ht 6\' 3"  (1.905 m)   Wt 200 lb (90.7 kg)   BMI 25.00 kg/m   Constitutional:  Well nourished. Alert and oriented, No acute distress. HEENT: [place] AT, mask in place.  Trachea midline, no masses. Cardiovascular: No clubbing, cyanosis, or edema. Respiratory: Normal respiratory effort, no increased work of breathing. Neurologic: Grossly intact, no focal deficits, moving all 4 extremities. Psychiatric: Normal mood and affect.  Laboratory Data: Lab Results  Component Value Date   WBC 4.0 10/11/2019   HGB 15.8 10/11/2019  HCT 44.5 10/11/2019   MCV 90.3 10/11/2019   PLT 168 10/11/2019    Lab Results  Component Value Date   CREATININE 1.13 10/11/2019       Component Value Date/Time   CHOL 198 10/11/2019 1140   HDL 60 10/11/2019 1140   CHOLHDL 3.3 10/11/2019 1140   LDLCALC 122 (H) 10/11/2019 1140    Lab Results  Component Value Date   AST 17 10/11/2019   Lab Results  Component Value Date   ALT 23 10/11/2019    Urinalysis    Component Value Date/Time   COLORURINE YELLOW 10/11/2019 1140   APPEARANCEUR Clear 10/24/2019  1333   LABSPEC 1.007 10/11/2019 1140   PHURINE > OR = 8.5 (A) 10/11/2019 1140   GLUCOSEU Negative 10/24/2019 1333   HGBUR NEGATIVE 10/11/2019 1140   BILIRUBINUR Negative 10/24/2019 1333   KETONESUR NEGATIVE 10/11/2019 1140   PROTEINUR Negative 10/24/2019 1333   PROTEINUR NEGATIVE 10/11/2019 1140   UROBILINOGEN 0.2 10/23/2019 1610   NITRITE Negative 10/24/2019 1333   NITRITE NEGATIVE 10/11/2019 1140   LEUKOCYTESUR Negative 10/24/2019 1333   LEUKOCYTESUR NEGATIVE 10/11/2019 1140    I have reviewed the labs.   Pertinent Imaging: CLINICAL DATA:  Patient status post lithotripsy on the left 10/25/2019.  EXAM: ABDOMEN - 1 VIEW  COMPARISON:  CT abdomen and pelvis 0 10/23/2019.  KUB 10/25/2019.  FINDINGS: Previously seen stone at the left ureterovesical junction is no longer present. No urinary tract stones are visible. Bowel gas pattern is normal.  IMPRESSION: Previously seen left UVJ stone is no longer present. No urinary tract stones are identified.   Electronically Signed   By: Drusilla Kanner M.D.   On: 11/08/2019 15:02 I have independently reviewed the film with the patient.  Agree with radiology.  No stone present.   Assessment & Plan:    1. Left UVJ stone Status post ESWL - doing well Stone no longer visible on KUB  Fragments sent for analysis Given ABC's of stone prevention booklet   2. Left hydronephrosis Obtain RUS to rule out iatrogenic hydronephrosis RTC in one month for report  3. Gross hematuria We will repeat UA upon return to ensure hematuria has resolved since the stone has been addressed If hematuria persists, we may need to pursue hematuria work-up  Return for RUS report and UA .  These notes generated with voice recognition software. I apologize for typographical errors.  Michiel Cowboy, PA-C  [facility] [address]  [address] [place], [place] [address] [phone]

## 2019-11-09 ENCOUNTER — Ambulatory Visit
Admission: RE | Admit: 2019-11-09 | Discharge: 2019-11-09 | Disposition: A | Discharge status: Home / Self Care | Payer: BC Managed Care – PPO | Source: Ambulatory Visit | Attending: Oncology | Admitting: Oncology

## 2019-11-09 ENCOUNTER — Other Ambulatory Visit: Payer: Self-pay

## 2019-11-09 DIAGNOSIS — R911 Solitary pulmonary nodule: Secondary | ICD-10-CM

## 2019-11-09 DIAGNOSIS — R918 Other nonspecific abnormal finding of lung field: Secondary | ICD-10-CM | POA: Diagnosis not present

## 2019-11-12 ENCOUNTER — Other Ambulatory Visit: Payer: Self-pay | Admitting: Urology

## 2019-11-13 ENCOUNTER — Inpatient Hospital Stay: Payer: BC Managed Care – PPO | Attending: Oncology | Admitting: Oncology

## 2019-11-13 DIAGNOSIS — R911 Solitary pulmonary nodule: Secondary | ICD-10-CM | POA: Diagnosis not present

## 2019-11-13 DIAGNOSIS — Z23 Encounter for immunization: Secondary | ICD-10-CM | POA: Diagnosis not present

## 2019-11-13 NOTE — Progress Notes (Signed)
Pulmonary Nodule Clinic Consult note [facility]  [phone] [phone]  Patient Care Team: Danelle Berry, Cordelia Poche as PCP - General (Family Medicine)   Name of the patient: Douglas Heath  [id number]  November 29, 1955   Date of visit: 11/13/2019   Diagnosis- Lung Nodule  Virtual Visit via Telephone Note   I connected with Mr. Micheletti on 11/13/19 at 11:15 am by telephone visit and verified that I am speaking with the correct person using two identifiers.   I discussed the limitations, risks, security and privacy concerns of performing an evaluation and management service by telemedicine and the availability of in-person appointments. I also discussed with the patient that there may be a patient responsible charge related to this service. The patient expressed understanding and agreed to proceed.   Other persons participating in the visit and their role in the encounter:   Patient's location: Home  Provider's location: Office  Chief complaint/ Reason for visit- Pulmonary Nodule Clinic Initial Visit  Past Medical History:  Patient is managed/referred by Danelle Berry, NP.  He was recently evaluated for high blood pressure and headache chest x-ray which revealed a 0.9 cm nodule in left upper lung.  It was recommended to have a nonemergent chest CT for further evaluation.  Minimal scarring or atelectasis was noted in the left lung base.  It was recommended he have a noncontrast chest CT in the next 1 to 2 weeks for further evaluation.  Interval history-patient presents to pulmonary nodule clinic to review recent CT chest without contrast.  Mr. Quiroa is doing well.  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  He is married and lives in [place] with his wife.  He has 2 children.  He is a non-smoker and has never smoked.  He currently chews tobacco.  Patient was recently diagnosed with  left kidney stone, left hydronephrosis, dysuria with gross hematuria.  Had CT renal stone study which showed a 12 mm stone.  Had ESWL for stone with Dr. Apolinar Junes on 10/25/2019.  He was also found to be hypertensive due to severe pain with blood pressure fluctuation.  Some minor changes were made to his blood pressure medicines. All has improved.   He denies any concerns at this time.  He is very active at home and enjoys running in his spare time.  ECOG FS:0 - Asymptomatic  Review of systems- Review of Systems  Constitutional: Negative.  Negative for chills, fever, malaise/fatigue and weight loss.  HENT: Negative for congestion, ear pain and tinnitus.   Eyes: Negative.  Negative for blurred vision and double vision.  Respiratory: Negative.  Negative for cough, sputum production and shortness of breath.   Cardiovascular: Negative.  Negative for chest pain, palpitations and leg swelling.  Gastrointestinal: Negative.  Negative for abdominal pain, constipation, diarrhea, nausea and vomiting.  Genitourinary: Negative for dysuria, frequency and urgency.  Musculoskeletal: Negative for back pain and falls.  Skin: Negative.  Negative for rash.  Neurological: Negative.  Negative for weakness and headaches.  Endo/Heme/Allergies: Negative.  Does not bruise/bleed easily.  Psychiatric/Behavioral: Negative.  Negative for depression. The patient is not nervous/anxious and does not have insomnia.      No Known Allergies   Past Medical History:  Diagnosis Date  . Hypertension      Past Surgical History:  Procedure Laterality Date  . EXTRACORPOREAL SHOCK WAVE LITHOTRIPSY Left 10/25/2019   Procedure: EXTRACORPOREAL SHOCK WAVE LITHOTRIPSY (ESWL);  Surgeon: Apolinar Junes,  Morrie Sheldon, MD;  Location: [facility] ORS;  Service: Urology;  Laterality: Left;    Social History   Socioeconomic History  . Marital status: Married    Spouse name: Timmothy Euler  . Number of children: 2  . Years of education: 62  . Highest education  level: Bachelor's degree (e.g., BA, AB, BS)  Occupational History  . Not on file  Tobacco Use  . Smoking status: Never Smoker  . Smokeless tobacco: Current User    Types: Snuff  Substance and Sexual Activity  . Alcohol use: Yes    Alcohol/week: 28.0 standard drinks    Types: 28 Shots of liquor per week  . Drug use: Never  . Sexual activity: Yes  Other Topics Concern  . Not on file  Social History Narrative  . Not on file   Social Determinants of Health   Financial Resource Strain:   . Difficulty of Paying Living Expenses:   Food Insecurity:   . Worried About Programme researcher, broadcasting/film/video in the Last Year:   . Barista in the Last Year:   Transportation Needs:   . Freight forwarder (Medical):   Marland Kitchen Lack of Transportation (Non-Medical):   Physical Activity:   . Days of Exercise per Week:   . Minutes of Exercise per Session:   Stress:   . Feeling of Stress :   Social Connections:   . Frequency of Communication with Friends and Family:   . Frequency of Social Gatherings with Friends and Family:   . Attends Religious Services:   . Active Member of Clubs or Organizations:   . Attends Banker Meetings:   Marland Kitchen Marital Status:   Intimate Partner Violence:   . Fear of Current or Ex-Partner:   . Emotionally Abused:   Marland Kitchen Physically Abused:   . Sexually Abused:     Family History  Problem Relation Age of Onset  . Diabetes Mother   . Obesity Mother   . Heart disease Father   . Leukemia Maternal Grandmother      Current Outpatient Medications:  .  lisinopril (ZESTRIL) 10 MG tablet, Take 1 tablet (10 mg total) by mouth daily., Disp: 90 tablet, Rfl: 3  Current Facility-Administered Medications:  .  sodium chloride (PF) 0.9 % injection 100 mL, 100 mL, Intravenous, Once, [name], MD  Physical exam: There were no vitals filed for this visit. Limited d/t telephone encounter  CMP Latest Ref Rng & Units 10/11/2019  Glucose 65 - 99 mg/dL 250(N)  BUN 7 - 25  mg/dL 14  Creatinine 3.97 - 6.73 mg/dL 4.19  Sodium 379 - 024 mmol/L 139  Potassium 3.5 - 5.3 mmol/L 4.9  Chloride 98 - 110 mmol/L 105  CO2 20 - 32 mmol/L 30  Calcium 8.6 - 10.3 mg/dL 9.3  Total Protein 6.1 - 8.1 g/dL 6.7  Total Bilirubin 0.2 - 1.2 mg/dL 0.6  AST 10 - 35 U/L 17  ALT 9 - 46 U/L 23   CBC Latest Ref Rng & Units 10/11/2019  WBC 3.8 - 10.8 Thousand/uL 4.0  Hemoglobin 13.2 - 17.1 g/dL 09.7  Hematocrit 35.3 - 50.0 % 44.5  Platelets 140 - 400 Thousand/uL 168    No images are attached to the encounter.  Abdomen 1 view (KUB)  Result Date: 11/08/2019 CLINICAL DATA:  Patient status post lithotripsy on the left 10/25/2019. EXAM: ABDOMEN - 1 VIEW COMPARISON:  CT abdomen and pelvis 0 10/23/2019.  KUB 10/25/2019. FINDINGS: Previously seen stone at the left  ureterovesical junction is no longer present. No urinary tract stones are visible. Bowel gas pattern is normal. IMPRESSION: Previously seen left UVJ stone is no longer present. No urinary tract stones are identified. Electronically Signed   By: Drusilla Kannerhomas  Dalessio M.D.   On: 11/08/2019 15:02   DG Abd 1 View  Result Date: 10/25/2019 CLINICAL DATA:  LEFT side kidney stone, lithotripsy EXAM: ABDOMEN - 1 VIEW COMPARISON:  CT abdomen and pelvis 10/23/2019 FINDINGS: Again identified calculus at LEFT ureterovesical junction region, 12 x 4 mm. No additional urinary tract calcifications. Bowel gas pattern normal. Osseous structures appear mildly demineralized. IMPRESSION: 12 x 4 mm calculus at LEFT ureterovesical junction, unchanged from recent CT. Electronically Signed   By: Ulyses SouthwardMark  Boles M.D.   On: 10/25/2019 09:28   CT Chest Wo Contrast  Result Date: 11/09/2019 CLINICAL DATA:  Lung nodule seen on chest radiograph CT contrast EXAM: CT CHEST WITHOUT CONTRAST TECHNIQUE: Multidetector CT imaging of the chest was performed following the standard protocol without IV contrast. COMPARISON:  Radiograph 10/11/2019 FINDINGS: Cardiovascular: Normal heart  size. No pericardial effusion. Few coronary artery calcifications. Atherosclerotic plaque within the normal caliber aorta. No aneurysm or ectasia. Normal 3 vessel branching of the aortic arch. Proximal great vessels are unremarkable. Central pulmonary arteries are normal caliber. Luminal evaluation precluded in the absence of contrast. Mediastinum/Nodes: No mediastinal fluid or gas. Normal thyroid gland and thoracic inlet. No acute abnormality of the trachea or esophagus. No worrisome mediastinal or axillary adenopathy. Few calcified left hilar nodes are present (2/88). Additional hilar nodal evaluation is limited in the absence of intravenous contrast media. Lungs/Pleura: Few clustered calcified nodules present in the superior segment left lower lobe. No worrisome nodules or masses to correspond to the abnormality seen on radiograph. No consolidation, features of edema, pneumothorax, or effusion. Upper Abdomen: No acute abnormalities present in the visualized portions of the upper abdomen. Punctate calcifications in the spleen likely reflect additional calcified granulomata. Musculoskeletal: Multilevel degenerative changes are present in the imaged portions of the spine. No acute osseous abnormality or suspicious osseous lesion. IMPRESSION: No CT correlate to the nodular opacity seen on comparison radiograph. Finding may have reflected superimposed osseous and soft tissues. Calcified hilar nodes, few calcified pulmonary nodules and few punctate calcifications in the spleen likely reflecting sequela of remote granulomatous disease/granulomata. Few coronary artery calcifications. Aortic Atherosclerosis (ICD10-I70.0). Electronically Signed   By: Kreg ShropshirePrice  DeHay M.D.   On: 11/09/2019 22:19   CT RENAL STONE STUDY  Result Date: 10/23/2019 CLINICAL DATA:  64 year old male with hematuria. EXAM: CT ABDOMEN AND PELVIS WITHOUT CONTRAST TECHNIQUE: Multidetector CT imaging of the abdomen and pelvis was performed following the  standard protocol without IV contrast. COMPARISON:  None. FINDINGS: Evaluation of this exam is limited in the absence of intravenous contrast. Lower chest: The visualized lung bases are clear. No intra-abdominal free air or free fluid. Hepatobiliary: No focal liver abnormality is seen. No gallstones, gallbladder wall thickening, or biliary dilatation. Pancreas: Unremarkable. No pancreatic ductal dilatation or surrounding inflammatory changes. Spleen: Normal in size without focal abnormality. Adrenals/Urinary Tract: The adrenal glands are unremarkable. There is a stone at the left ureterovesical junction which measures up to 12 mm in length. There is mild left hydro nephro ureter. There is no hydronephrosis or nephrolithiasis on the right. A 9 mm exophytic hypodense lesion from the medial inferior pole of the right kidney is not characterized but may represent a cyst. The right ureter is unremarkable. The urinary bladder is unremarkable as well. Stomach/Bowel:  There is sigmoid diverticulosis without active inflammatory changes. There is moderate stool throughout the colon. There is no bowel obstruction or active inflammation. The appendix is normal. Vascular/Lymphatic: Mild aortoiliac atherosclerotic disease. The IVC is unremarkable. No portal venous gas. There is no adenopathy. Reproductive: Enlarged prostate gland measuring 6 cm in transverse axial diameter. The seminal vesicles are symmetric. Other: Metallic coils in the scrotum likely related to bursectomy. Musculoskeletal: Mild degenerative changes of the spine. No acute osseous pathology. IMPRESSION: 1. A 12 mm left UVJ stone with mild left hydronephrosis. 2. Sigmoid diverticulosis. No bowel obstruction or active inflammation. Normal appendix. Electronically Signed   By: Elgie Collard M.D.   On: 10/23/2019 18:01   Assessment and plan- Patient is a 64 y.o. male who presents to pulmonary nodule clinic for follow-up of incidental lung nodules.  A telephone  visit was conducted to review most recent CT scan results.    CT chest without contrast from 11/09/2019 few clustered calcified nodules present in the superior segment left lower lobe.  No worrisome nodules or masses to correspond to the abnormality seen on chest CT.  No consolidation features or edema, pneumothorax or effusion.  Findings may have reflected superimposed osseous and soft tissue.   Calculating malignancy probability of a pulmonary nodule: Risk factors include: 1.  Age. 2.  Cancer history. 3.  Diameter of pulmonary nodule and mm 4.  Location 5.  Smoking history 6.  Spiculation present   Based on risk factors, this patient is Low risk for the development of lung cancer.  Patient does not need any additional follow-up in our clinic.  Patient is at risk for cancers of the mouth, tongue, cheek and gum.  He also could develop cancer of the esophagus. Increased risk of development of pancreatic cancer have also been seen.   During our visit, we discussed pulmonary nodules are a common incidental finding and are often how lung cancer is discovered.  Lung cancer survival is directly related to the stage at diagnosis.  We discussed that nodules can vary in presentation from solitary pulmonary nodules to masses, 2 groundglass opacities and multiple nodules.  Pulmonary nodules in the majority of cases are benign but the probability of these becoming malignant cannot be undermined.  Early identification of malignant nodules could lead to early diagnosis and increased survival.   We discussed the probability of pulmonary nodules becoming malignant increase with age, pack years of tobacco use, size/characteristics of the nodule and location; with upper lobe involvement being most worrisome.   We discussed the goal of our clinic is to thoroughly evaluate each nodule, developed a comprehensive, individualized plan of care utilizing the most advanced technology and significantly reduce the time from  detection to treatment.  A dedicated pulmonary nodule clinic has proven to indeed expedite the detection and treatment of lung cancer.   Patient education in fact sheet provided along with most recent CT scans.  Disposition: Patient does not need any additional follow-up in our lung nodule clinic. Note sent to PCP.    Visit Diagnosis 1. Nodule of left lung     Patient expressed understanding and was in agreement with this plan. He also understands that He can call clinic at any time with any questions, concerns, or complaints.   Greater than 50% was spent in counseling and coordination of care with this patient including but not limited to discussion of the relevant topics above (See A&P) including, but not limited to diagnosis and management of acute and chronic  medical conditions.   Thank you for allowing me to participate in the care of this very pleasant patient.    Jacquelin Hawking, NP [place] [facility] Cell - [phone] Pager- [phone] 11/13/2019 2:58 PM   RE: Verline Lema, PA

## 2019-11-16 NOTE — Progress Notes (Signed)
Great thank you!   This fits his history.  Thanks for getting this checked up on for [place]  Danelle Berry, PA-C

## 2019-11-27 ENCOUNTER — Other Ambulatory Visit: Payer: Self-pay | Admitting: Radiology

## 2019-11-27 DIAGNOSIS — N201 Calculus of ureter: Secondary | ICD-10-CM

## 2019-12-12 ENCOUNTER — Ambulatory Visit: Payer: BC Managed Care – PPO | Admitting: Urology

## 2019-12-17 ENCOUNTER — Other Ambulatory Visit: Payer: Self-pay

## 2019-12-17 ENCOUNTER — Ambulatory Visit
Admission: RE | Admit: 2019-12-17 | Discharge: 2019-12-17 | Disposition: A | Discharge status: Home / Self Care | Payer: BC Managed Care – PPO | Source: Ambulatory Visit | Attending: Urology | Admitting: Urology

## 2019-12-17 DIAGNOSIS — N201 Calculus of ureter: Secondary | ICD-10-CM | POA: Diagnosis not present

## 2019-12-17 DIAGNOSIS — N281 Cyst of kidney, acquired: Secondary | ICD-10-CM | POA: Diagnosis not present

## 2019-12-20 ENCOUNTER — Ambulatory Visit: Payer: BC Managed Care – PPO | Admitting: Urology

## 2019-12-20 ENCOUNTER — Other Ambulatory Visit: Payer: Self-pay

## 2019-12-20 ENCOUNTER — Encounter: Payer: Self-pay | Admitting: Urology

## 2019-12-20 VITALS — BP 156/90 | HR 55 | Ht 75.0 in | Wt 200.0 lb

## 2019-12-20 DIAGNOSIS — R310 Gross hematuria: Secondary | ICD-10-CM | POA: Diagnosis not present

## 2019-12-20 DIAGNOSIS — N201 Calculus of ureter: Secondary | ICD-10-CM

## 2019-12-20 DIAGNOSIS — N132 Hydronephrosis with renal and ureteral calculous obstruction: Secondary | ICD-10-CM

## 2019-12-20 DIAGNOSIS — N400 Enlarged prostate without lower urinary tract symptoms: Secondary | ICD-10-CM

## 2019-12-20 DIAGNOSIS — N486 Induration penis plastica: Secondary | ICD-10-CM

## 2019-12-20 NOTE — Progress Notes (Signed)
12/20/2019 9:59 AM   Douglas Heath 03-Apr-1956 [id number]  Referring provider: Delsa Grana, PA-C [address] [place],  [place] [address]  Chief Complaint  Patient presents with  . Nephrolithiasis    HPI: Douglas Heath is a 64 year old male with BPH, Peyronie's disease nephrolithiasis who presents today to discuss RUS results.   BPH Volume 63 cc on 10/2019 CT.  No recent PSA or DRE.  No urinary complaints at this time.  No family history of prostate cancer.  Patient denies any modifying or aggravating factors.  Patient denies any gross hematuria, dysuria or suprapubic/flank pain.  Patient denies any fevers, chills, nausea or vomiting.   Peyronie's disease Stable curve.  Able to penetrate.  No interest in treatment at this time.  Nephrolithiasis CT 10/23/2019 showing a 12 mm UVJ stone with hydroureteronephrosis.  No other stone burden.  Underwent ESWL 10/25/2019 with Dr. Erlene Quan.  KUB 11/08/2019 NED.  RUS 12/17/2019 NED.   Stone composition 35% Ca Ox Monohydrate, 62% Ca Ox Dihydrate and 3% Ca Phosphate Carbonate.  First stone episode.    PMH: Past Medical History:  Diagnosis Date  . Hypertension     Surgical History: Past Surgical History:  Procedure Laterality Date  . EXTRACORPOREAL SHOCK WAVE LITHOTRIPSY Left 10/25/2019   Procedure: EXTRACORPOREAL SHOCK WAVE LITHOTRIPSY (ESWL);  Surgeon: Hollice Espy, MD;  Location: [facility] ORS;  Service: Urology;  Laterality: Left;    Home Medications:  Allergies as of 12/20/2019   No Known Allergies     Medication List       Accurate as of December 20, 2019  9:59 AM. If you have any questions, ask your nurse or doctor.        lisinopril 10 MG tablet Commonly known as: ZESTRIL Take 1 tablet (10 mg total) by mouth daily.       Allergies: No Known Allergies  Family History: Family History  Problem Relation Age of Onset  . Diabetes Mother   . Obesity Mother   . Heart disease Father   . Leukemia Maternal Grandmother       Social History:  reports that he has never smoked. His smokeless tobacco use includes snuff. He reports current alcohol use of about 28.0 standard drinks of alcohol per week. He reports that he does not use drugs.  ROS: Pertinent ROS in HPI  Physical Exam: BP (!) 156/90   Pulse (!) 55   Ht 6\' 3"  (1.905 m)   Wt 200 lb (90.7 kg)   BMI 25.00 kg/m Constitutional:  Well nourished. Alert and oriented, No acute distress. HEENT: [place] AT, mask in place.  Trachea midline. Cardiovascular: No clubbing, cyanosis, or edema. Respiratory: Normal respiratory effort, no increased work of breathing. Neurologic: Grossly intact, no focal deficits, moving all 4 extremities. Psychiatric: Normal mood and affect.  Laboratory Data: Lab Results  Component Value Date   WBC 4.0 10/11/2019   HGB 15.8 10/11/2019   HCT 44.5 10/11/2019   MCV 90.3 10/11/2019   PLT 168 10/11/2019    Lab Results  Component Value Date   CREATININE 1.13 10/11/2019       Component Value Date/Time   CHOL 198 10/11/2019 1140   HDL 60 10/11/2019 1140   CHOLHDL 3.3 10/11/2019 1140   LDLCALC 122 (H) 10/11/2019 1140    Lab Results  Component Value Date   AST 17 10/11/2019   Lab Results  Component Value Date   ALT 23 10/11/2019    Urinalysis Component  Latest Ref Rng & Units 12/20/2019  Specific Gravity, UA     1.005 - 1.030 1.020  pH, UA     5.0 - 7.5 8.5 (H)  Color, UA     Yellow Yellow  Appearance Ur     Clear Clear  Leukocytes,UA     Negative Negative  Protein,UA     Negative/Trace Negative  Glucose, UA     Negative Negative  Ketones, UA     Negative Negative  RBC, UA     Negative Negative  Bilirubin, UA     Negative Negative  Urobilinogen, Ur     0.2 - 1.0 mg/dL 0.2  Nitrite, UA     Negative Negative  Microscopic Examination      See below:   Component     Latest Ref Rng & Units 12/20/2019  WBC, UA     0 - 5 /hpf 0-5  RBC     0 - 2 /hpf 0-2  Epithelial Cells (non renal)     0 - 10  /hpf 0-10  Bacteria, UA     None seen/Few None seen   I have reviewed the labs.   Pertinent Imaging: CLINICAL DATA:  Left ureteral stone.  Status post lithotripsy.  EXAM: RENAL / URINARY TRACT ULTRASOUND COMPLETE  COMPARISON:  None.  FINDINGS: Right Kidney:  Renal measurements: 12.2 x 5.9 x 5.2 cm = volume: 195 mL . Echogenicity within normal limits. 9 mm simple cyst is seen in lower pole. No mass or hydronephrosis visualized.  Left Kidney:  Renal measurements: 12.4 x 6.5 x 5.4 cm = volume: 225 mL. Echogenicity within normal limits. No mass or hydronephrosis visualized.  Bladder:  Appears normal for degree of bladder distention. Bilateral ureteral jets are noted.  Other:  None.  IMPRESSION: Small right renal cyst.  No other renal abnormality is noted.   Electronically Signed   By: Lupita Raider M.D.   On: 12/17/2019 14:56  I have independently reviewed the films and do not appreciate hydronephrosis.    Assessment & Plan:    1. Left ureteral stone - s/p successful left ESWL - not interested in metabolic work up at this time  2. Gross hematuria - Urinalysis, Complete - neg for micro heme - secondary to stone - resolved  3. BPH - discussed prostate cancer screening and AUA's recommendation that men 10-25 years of age would benefit from yearly screening, he declines  4. Peyronie's disease - not interested in treatment at this time    Return if symptoms worsen or fail to improve.  These notes generated with voice recognition software. I apologize for typographical errors.  Michiel Cowboy, PA-C  [facility] [address]  [address] [place], [place] [address] [phone]

## 2019-12-21 LAB — URINALYSIS, COMPLETE
Bilirubin, UA: NEGATIVE
Glucose, UA: NEGATIVE
Ketones, UA: NEGATIVE
Leukocytes,UA: NEGATIVE
Nitrite, UA: NEGATIVE
Protein,UA: NEGATIVE
RBC, UA: NEGATIVE
Specific Gravity, UA: 1.02 (ref 1.005–1.030)
Urobilinogen, Ur: 0.2 mg/dL (ref 0.2–1.0)
pH, UA: 8.5 — ABNORMAL HIGH (ref 5.0–7.5)

## 2019-12-21 LAB — MICROSCOPIC EXAMINATION: Bacteria, UA: NONE SEEN

## 2019-12-26 NOTE — Progress Notes (Signed)
Lvm asking pt to return call to schedule appt with [facility]

## 2020-03-18 ENCOUNTER — Ambulatory Visit: Payer: Self-pay | Admitting: *Deleted

## 2020-03-18 DIAGNOSIS — T63441A Toxic effect of venom of bees, accidental (unintentional), initial encounter: Secondary | ICD-10-CM | POA: Diagnosis not present

## 2020-03-18 DIAGNOSIS — L03114 Cellulitis of left upper limb: Secondary | ICD-10-CM | POA: Diagnosis not present

## 2020-03-18 DIAGNOSIS — T63451A Toxic effect of venom of hornets, accidental (unintentional), initial encounter: Secondary | ICD-10-CM | POA: Diagnosis not present

## 2020-03-18 DIAGNOSIS — T63461A Toxic effect of venom of wasps, accidental (unintentional), initial encounter: Secondary | ICD-10-CM | POA: Diagnosis not present

## 2020-03-18 NOTE — Telephone Encounter (Signed)
  Pt called in c/o swelling, itching, warm to the touch and redness from a hornet or wasp sting he got on Sunday.   He was stung on the inner arm just above the wrist on the left.   His whole hand and wrist are very swollen.   "I don't have a wrist it's so swollen".   "It's getting worse and is hot to the touch and red".  It itches real bad too.  No pain at this point.   I had pain when I was stung but not now.  No history of reactions in the past to stings.  I went over the precautions for going to the ED:  Shortness of breath, chest tightness, swelling of throat, lips, tongue or breaking out in hives.  He verbalized understanding.  Since there were no appts today I instructed him to go on to the urgent care center since his symptoms were getting worse instead of better.  He said there was an urgent care near him that he will go to.     Reason for Disposition . [1] Red or very tender (to touch) area AND [2] getting larger over 48 hours after the sting  Answer Assessment - Initial Assessment Questions 1. TYPE: "What type of sting was it?" (bee, yellow jacket, etc.)      I had on gloves and I got a hornet or wasp in my glove on Sunday at lunch time.   I'm taking Benadryl and Calamine lotion.  It itches bad. This morning the swelling is worse and 1/2 up my forearm red and hot to touch.   Left hand and arm. 2. ONSET: "When did it occur?"      Sunday lunch time 3. LOCATION: "Where is the sting located?"  "How many stings?"     Left hand.   1 sting   Stung on left arm inside of my arm above the wrist. 4. SWELLING SIZE: "How big is the swelling?" (e.g., inches or cm)     Left hand is swollen very much and red,, hot to the touch.  My wrist is swollon. 5. REDNESS: "Is the area red or pink?" If Yes, ask: "What size is area of redness?" (e.g., inches or cm). "When did the redness start?"     Red 6. PAIN: "Is there any pain?" If Yes, ask: "How bad is it?"  (Scale 1-10; or mild, moderate, severe)     No  pain now.   7. ITCHING: "Is there any itching?" If Yes, ask: "How bad is it?"      Yes itchy and swollen. 8. RESPIRATORY DISTRESS: "Describe your breathing."     No shortness of breath 9. PRIOR REACTIONS: "Have you had any severe allergic reactions to stings in the past?" if yes, ask: "What happened?"     No 10. OTHER SYMPTOMS: "Do you have any other symptoms?" (e.g., abdominal pain, face or tongue swelling, new rash elsewhere, vomiting)       Taking 2 Benadryl every 12 hours. 11. PREGNANCY: "Is there any chance you are pregnant?" "When was your last menstrual period?"       N/A  Protocols used: BEE OR YELLOW JACKET STING-A-AH

## 2020-03-26 NOTE — Progress Notes (Signed)
Patient ID: Douglas Heath, male    DOB: 1955/12/12, 64 y.o.   MRN: [id number]  PCP: Danelle Berry, PA-C  Chief Complaint  Patient presents with  . Hypertension    discuss changing medication he does not feel Lisinopril 10mg  is working, says BP still runs 160/100    Subjective:   Douglas Heath is a 64 y.o. male, presents to clinic with CC of the following:  Chief Complaint  Patient presents with  . Hypertension    discuss changing medication he does not feel Lisinopril 10mg  is working, says BP still runs 160/100    HPI:  Patient is a 64 year old male patient of Last visit with her was on 10/31/2019 Follows up today.  HTN Pt with high bp at initial appt, found to have kidney stone shortly afterwards and he did endorse severe pain and blood pressure was elevated for several of those visits, his kidney stone has been treated by urology and he has passed the stone and he is feeling much improved with persistent blood pressure elevation noted.  BP Readings from Last 3 Encounters:  03/27/20 (!) 152/94  12/20/19 (!) 156/90  11/08/19 (!) 157/98   Current regimen- lisinopril 10 mg daily  Taking medicine regularly. Does  check blood pressure at home, usually running in the 150-180/95-110 range, although he did have one on Sunday at 125/82 noted.  Most of the blood pressures have been more elevated.  has been trying to work on diet/lifestyle changes including cutting back on dip, alcohol, improving exercise and his diet noted  Pt denies chest pain, SOB, palpitations, increased lower extremity swelling, and has no significant cardiac history noted.  Denies increased headaches or vision changes.  Notes he just does not feel as well when his pressure is up, more fatigued feeling.  A prior CXR showed a potential lung nodule-patient had a CT of the chest 11/09/2019 and a follow-up with oncology a few days later Impression from the CT scan was as follows: No CT correlate to the nodular  opacity seen on comparison  radiograph. Finding may have reflected superimposed osseous and soft  tissues.   Calcified hilar nodes, few calcified pulmonary nodules and few  punctate calcifications in the spleen likely reflecting sequela of  remote granulomatous disease/granulomata.  Few coronary artery calcifications.  Aortic Atherosclerosis (ICD10-I70.0).   Oncology felt no further follow-up needed with patient, and that note was reviewed.  HLD Recent Labs       Lab Results  Component Value Date   CHOL 198 10/11/2019   HDL 60 10/11/2019   LDLCALC 122 (H) 10/11/2019   TRIG 70 10/11/2019   CHOLHDL 3.3 10/11/2019     Noted his LDL was slightly above desired on last check in February. Really did not want to take medications, and not on a statin presently.   Recent kidney stone -has been followed by urology, with the last visit in April.  Was noted he had successful ESWL.  Declined metabolic work-up.  Tobacco-never smoker No diabetes history  Patient Active Problem List   Diagnosis Date Noted  . Nodule of left lung 10/23/2019  . Enlarged prostate 10/23/2019  . Left nephrolithiasis 10/23/2019  . Sigmoid diverticulosis 10/23/2019  . Hypertension 10/15/2019  . Family history of cardiac disorder in father 10/15/2019      Current Outpatient Medications:  .  lisinopril (ZESTRIL) 10 MG tablet, Take 1 tablet (10 mg total) by mouth daily., Disp: 90 tablet, Rfl: 3  Current Facility-Administered  Medications:  .  sodium chloride (PF) 0.9 % injection 100 mL, 100 mL, Intravenous, Once, [name], MD   No Known Allergies   Past Surgical History:  Procedure Laterality Date  . EXTRACORPOREAL SHOCK WAVE LITHOTRIPSY Left 10/25/2019   Procedure: EXTRACORPOREAL SHOCK WAVE LITHOTRIPSY (ESWL);  Surgeon: [name], MD;  Location: [facility] ORS;  Service: Urology;  Laterality: Left;     Family History  Problem Relation Age of Onset  . Diabetes Mother   . Obesity Mother    . Heart disease Father   . Leukemia Maternal Grandmother      Social History   Tobacco Use  . Smoking status: Never Smoker  . Smokeless tobacco: Current User    Types: Snuff  Substance Use Topics  . Alcohol use: Yes    Alcohol/week: 28.0 standard drinks    Types: 28 Shots of liquor per week    With staff assistance, above reviewed with the patient today.  ROS: As per HPI, otherwise no specific complaints on a limited and focused system review   No results found for this or any previous visit (from the past 72 hour(s)).   PHQ2/9: Depression screen [facility] 2/9 03/27/2020 10/31/2019 10/23/2019 10/11/2019  Decreased Interest 0 0 0 0  Down, Depressed, Hopeless 0 0 0 0  PHQ - 2 Score 0 0 0 0  Altered sleeping 0 0 0 0  Tired, decreased energy 0 0 0 0  Change in appetite 0 0 0 0  Feeling bad or failure about yourself  0 0 0 0  Trouble concentrating 0 0 0 0  Moving slowly or fidgety/restless 0 0 0 0  Suicidal thoughts 0 0 0 0  PHQ-9 Score 0 0 0 0  Difficult doing work/chores Not difficult at all Not difficult at all Not difficult at all Not difficult at all   PHQ-2/9 Result is neg  Fall Risk: Fall Risk  03/27/2020 10/31/2019 10/23/2019 10/11/2019  Falls in the past year? 0 0 0 0  Number falls in past yr: 0 0 0 0  Injury with Fall? 0 0 0 0      Objective:   Vitals:   03/27/20 1120  BP: (!) 152/94  Pulse: 65  Resp: 16  Temp: 97.9 F (36.6 C)  TempSrc: Temporal  SpO2: 99%  Weight: (!) 204 lb 12.8 oz (92.9 kg)  Height: 6\' 3"  (1.905 m)    Body mass index is 25.6 kg/m. Recheck blood pressure was 150/96 by myself on the left with an adult cuff  Physical Exam   NAD, masked, very pleasant HEENT - [place]/AT, sclera anicteric, PERRL, EOMI, conj - non-inj'ed,  pharynx clear Neck - supple, no adenopathy, no TM, carotids 2+ and = without bruits bilat Car - RRR without m/g/r Pulm- RR and effort normal at rest, CTA without wheeze or rales Abd - soft, NT diffusely, ND,  Back - no CVA  tenderness Ext - no LE edema,  Neuro/psychiatric - affect was not flat, appropriate with conversation  Alert and oriented  Grossly non-focal   Speech  normal   Results for orders placed or performed in visit on 12/20/19  Microscopic Examination   URINE  Result Value Ref Range   WBC, UA 0-5 0 - 5 /hpf   RBC 0-2 0 - 2 /hpf   Epithelial Cells (non renal) 0-10 0 - 10 /hpf   Bacteria, UA None seen None seen/Few  Urinalysis, Complete  Result Value Ref Range   Specific Gravity, UA 1.020 1.005 -  1.030   pH, UA 8.5 (H) 5.0 - 7.5   Color, UA Yellow Yellow   Appearance Ur Clear Clear   Leukocytes,UA Negative Negative   Protein,UA Negative Negative/Trace   Glucose, UA Negative Negative   Ketones, UA Negative Negative   RBC, UA Negative Negative   Bilirubin, UA Negative Negative   Urobilinogen, Ur 0.2 0.2 - 1.0 mg/dL   Nitrite, UA Negative Negative   Microscopic Examination See below:    Last labs reviewed.    Assessment & Plan:   1. Hypertension, unspecified type Blood pressure has not been very well controlled in the recent past, both with checks in the office setting, and also at home. Discussed the importance of getting better blood pressure control over time, and he noted being resistant somewhat to medications initially, although agrees now with increasing the medications to help better control. Do feel adding a second medicine in combination with the lisinopril will be helpful, and can have at all and 1 tablet to take daily. Prescribed the lisinopril hydrochlorothiazide combination 20/12.5 daily After 5 to 7 days taking them medication, will check blood pressures at home, recommended taking at different times of the day, and recording these numbers and bring those with him on a follow-up visit.  Noted trying to get a sense of his average blood pressure over time with these episodic readings. His kidney function was good on prior check, discussed the need to recheck labs likely on  that follow-up visit. - lisinopril-hydrochlorothiazide (ZESTORETIC) 20-12.5 MG tablet; Take 1 tablet by mouth daily.  Dispense: 90 tablet; Refill: 3  2. Hyperlipidemia, unspecified hyperlipidemia type His LDL was above desired range on last check, and will continue to monitor.  Reviewed cardiac risk factors, with increased lipids 1 as well. We will hold off on any recommendations for a statin presently, although may recommend over time pending recheck of labs and his clinical status and success with lifestyle modifications..  3. Nodule of left lung On follow-up with a CT obtained, no lung nodule was found at the area noted on the chest x-ray. No further follow-up felt needed.  4. Left nephrolithiasis He has continued to remain free of symptoms from kidney stones, with no flank pains, no urinary symptoms of concern. No pain from this contributing to his elevated blood pressures in the recent past recorded at home.  We will schedule follow-up visit in approximately 4 to 6 weeks time to reassess his blood pressure with the increase medicines added today, also will check labs again at that point likely, and follow-up sooner as needed.     Jamelle Haring, MD 03/27/20 11:39 AM

## 2020-03-27 ENCOUNTER — Encounter: Payer: Self-pay | Admitting: Internal Medicine

## 2020-03-27 ENCOUNTER — Ambulatory Visit: Payer: BC Managed Care – PPO | Admitting: Internal Medicine

## 2020-03-27 ENCOUNTER — Other Ambulatory Visit: Payer: Self-pay

## 2020-03-27 VITALS — BP 152/94 | HR 65 | Temp 97.9°F | Resp 16 | Ht 75.0 in | Wt 204.8 lb

## 2020-03-27 DIAGNOSIS — I10 Essential (primary) hypertension: Secondary | ICD-10-CM

## 2020-03-27 DIAGNOSIS — R911 Solitary pulmonary nodule: Secondary | ICD-10-CM

## 2020-03-27 DIAGNOSIS — N200 Calculus of kidney: Secondary | ICD-10-CM | POA: Diagnosis not present

## 2020-03-27 DIAGNOSIS — E785 Hyperlipidemia, unspecified: Secondary | ICD-10-CM

## 2020-03-27 MED ORDER — LISINOPRIL-HYDROCHLOROTHIAZIDE 20-12.5 MG PO TABS: 1.0000 | ORAL_TABLET | Freq: Every day | ORAL | 3 refills | Status: DC

## 2020-03-27 NOTE — Patient Instructions (Signed)

## 2020-04-28 ENCOUNTER — Emergency Department
Admission: EM | Admit: 2020-04-28 | Discharge: 2020-04-28 | Disposition: A | Discharge status: Home / Self Care | Payer: BC Managed Care – PPO | Attending: Emergency Medicine | Admitting: Emergency Medicine

## 2020-04-28 ENCOUNTER — Encounter: Payer: Self-pay | Admitting: Emergency Medicine

## 2020-04-28 ENCOUNTER — Other Ambulatory Visit: Payer: Self-pay

## 2020-04-28 ENCOUNTER — Ambulatory Visit: Payer: Self-pay

## 2020-04-28 DIAGNOSIS — R197 Diarrhea, unspecified: Secondary | ICD-10-CM | POA: Diagnosis not present

## 2020-04-28 DIAGNOSIS — Z79899 Other long term (current) drug therapy: Secondary | ICD-10-CM | POA: Insufficient documentation

## 2020-04-28 DIAGNOSIS — R42 Dizziness and giddiness: Secondary | ICD-10-CM | POA: Diagnosis not present

## 2020-04-28 DIAGNOSIS — R109 Unspecified abdominal pain: Secondary | ICD-10-CM | POA: Diagnosis not present

## 2020-04-28 DIAGNOSIS — I10 Essential (primary) hypertension: Secondary | ICD-10-CM | POA: Insufficient documentation

## 2020-04-28 LAB — LIPASE, BLOOD: Lipase: 26 U/L (ref 11–51)

## 2020-04-28 LAB — CBC
HCT: 45.3 % (ref 39.0–52.0)
Hemoglobin: 16 g/dL (ref 13.0–17.0)
MCH: 31.7 pg (ref 26.0–34.0)
MCHC: 35.3 g/dL (ref 30.0–36.0)
MCV: 89.7 fL (ref 80.0–100.0)
Platelets: 125 10*3/uL — ABNORMAL LOW (ref 150–400)
RBC: 5.05 MIL/uL (ref 4.22–5.81)
RDW: 12.9 % (ref 11.5–15.5)
WBC: 6.3 10*3/uL (ref 4.0–10.5)
nRBC: 0 % (ref 0.0–0.2)

## 2020-04-28 LAB — URINALYSIS, COMPLETE (UACMP) WITH MICROSCOPIC
Bacteria, UA: NONE SEEN
Bilirubin Urine: NEGATIVE
Glucose, UA: NEGATIVE mg/dL
Ketones, ur: 5 mg/dL — AB
Leukocytes,Ua: NEGATIVE
Nitrite: NEGATIVE
Protein, ur: NEGATIVE mg/dL
Specific Gravity, Urine: 1.003 — ABNORMAL LOW (ref 1.005–1.030)
Squamous Epithelial / HPF: NONE SEEN (ref 0–5)
pH: 6 (ref 5.0–8.0)

## 2020-04-28 LAB — COMPREHENSIVE METABOLIC PANEL
ALT: 47 U/L — ABNORMAL HIGH (ref 0–44)
AST: 35 U/L (ref 15–41)
Albumin: 4.2 g/dL (ref 3.5–5.0)
Alkaline Phosphatase: 74 U/L (ref 38–126)
Anion gap: 8 (ref 5–15)
BUN: 16 mg/dL (ref 8–23)
CO2: 27 mmol/L (ref 22–32)
Calcium: 9.1 mg/dL (ref 8.9–10.3)
Chloride: 101 mmol/L (ref 98–111)
Creatinine, Ser: 0.96 mg/dL (ref 0.61–1.24)
GFR calc Af Amer: >60 mL/min (ref >60)
GFR calc non Af Amer: >60 mL/min (ref >60)
Glucose, Bld: 110 mg/dL — ABNORMAL HIGH (ref 70–99)
Potassium: 3.8 mmol/L (ref 3.5–5.1)
Sodium: 136 mmol/L (ref 135–145)
Total Bilirubin: 1.2 mg/dL (ref 0.3–1.2)
Total Protein: 7.4 g/dL (ref 6.5–8.1)

## 2020-04-28 NOTE — ED Provider Notes (Signed)
[facility] Emergency Department Provider Note ____________________________________________   First MD Initiated Contact with Patient 04/28/20 2211     (approximate)  I have reviewed the triage vital signs and the nursing notes.   HISTORY  Chief Complaint Diarrhea and Abdominal Pain    HPI Kingdom Vanzanten is a 64 y.o. male with PMH as noted below who presents with diarrhea, acute onset 2 days ago, persistent course through this morning, and now mostly resolved.  He reports 7-10 episodes yesterday but most of the day today he has been doing well.  There is no blood in the stool that he has noticed.  He has had associated crampy abdominal pain which was very frequent previously but now only occurs every 3-4 hours or so.  The patient reports associated lightheadedness.  He denies any associated nausea or vomiting, fever or chills, or other acute symptoms.  The patient denies any unusual foods or change in his diet.  He traveled to [place] recently but has not been overseas.  He has not been hospitalized or on antibiotics recently.  He is vaccinated for COVID-19.  He contacted his PMD today and was instructed to come to the ED.  Past Medical History:  Diagnosis Date  . Hypertension     Patient Active Problem List   Diagnosis Date Noted  . Enlarged prostate 10/23/2019  . Left nephrolithiasis 10/23/2019  . Sigmoid diverticulosis 10/23/2019  . Hypertension 10/15/2019  . Family history of cardiac disorder in father 10/15/2019    Past Surgical History:  Procedure Laterality Date  . EXTRACORPOREAL SHOCK WAVE LITHOTRIPSY Left 10/25/2019   Procedure: EXTRACORPOREAL SHOCK WAVE LITHOTRIPSY (ESWL);  Surgeon: [name], MD;  Location: [facility] ORS;  Service: Urology;  Laterality: Left;    Prior to Admission medications   Medication Sig Start Date End Date Taking? Authorizing Provider  lisinopril (ZESTRIL) 10 MG tablet Take 1 tablet (10 mg total) by mouth daily. 10/11/19    Danelle Berry, PA-C  lisinopril-hydrochlorothiazide (ZESTORETIC) 20-12.5 MG tablet Take 1 tablet by mouth daily. 03/27/20   Jamelle Haring, MD    Allergies Patient has no known allergies.  Family History  Problem Relation Age of Onset  . Diabetes Mother   . Obesity Mother   . Heart disease Father   . Leukemia Maternal Grandmother     Social History Social History   Tobacco Use  . Smoking status: Never Smoker  . Smokeless tobacco: Former Neurosurgeon    Types: Snuff  Vaping Use  . Vaping Use: Never used  Substance Use Topics  . Alcohol use: Yes    Alcohol/week: 28.0 standard drinks    Types: 28 Shots of liquor per week  . Drug use: Never    Review of Systems  Constitutional: No fever/chills. Eyes: No redness. ENT: No sore throat. Cardiovascular: Denies chest pain. Respiratory: Denies shortness of breath. Gastrointestinal: No vomiting.  Positive for diarrhea. Genitourinary: Negative for dysuria.  Musculoskeletal: Negative for back pain. Skin: Negative for rash. Neurological: Positive for resolved headache.   ____________________________________________   PHYSICAL EXAM:  VITAL SIGNS: ED Triage Vitals  Enc Vitals Group     BP 04/28/20 1112 112/85     Pulse Rate 04/28/20 1112 68     Resp 04/28/20 1112 16     Temp 04/28/20 1112 98.7 F (37.1 C)     Temp Source 04/28/20 1112 Oral     SpO2 04/28/20 1112 98 %     Weight 04/28/20 1113 205 lb (93 kg)  Height 04/28/20 1113 6\' 3"  (1.905 m)     Head Circumference --      Peak Flow --      Pain Score 04/28/20 1113 2     Pain Loc --      Pain Edu? --      Excl. in GC? --     Constitutional: Alert and oriented. Well appearing and in no acute distress. Eyes: Conjunctivae are normal.  No scleral icterus. Head: Atraumatic. Nose: No congestion/rhinnorhea. Mouth/Throat: Mucous membranes are moist.   Neck: Normal range of motion.  Cardiovascular: Normal rate, regular rhythm.  Good peripheral  circulation. Respiratory: Normal respiratory effort.  No retractions.  Gastrointestinal: Soft and nontender. No distention.  Genitourinary: No flank tenderness. Musculoskeletal:  Extremities warm and well perfused.  Neurologic:  Normal speech and language. No gross focal neurologic deficits are appreciated.  Skin:  Skin is warm and dry. No rash noted. Psychiatric: Mood and affect are normal. Speech and behavior are normal.  ____________________________________________   LABS (all labs ordered are listed, but only abnormal results are displayed)  Labs Reviewed  COMPREHENSIVE METABOLIC PANEL - Abnormal; Notable for the following components:      Result Value   Glucose, Bld 110 (*)    ALT 47 (*)    All other components within normal limits  CBC - Abnormal; Notable for the following components:   Platelets 125 (*)    All other components within normal limits  URINALYSIS, COMPLETE (UACMP) WITH MICROSCOPIC - Abnormal; Notable for the following components:   Color, Urine STRAW (*)    APPearance CLEAR (*)    Specific Gravity, Urine 1.003 (*)    Hgb urine dipstick SMALL (*)    Ketones, ur 5 (*)    All other components within normal limits  LIPASE, BLOOD   ____________________________________________  EKG   ____________________________________________  RADIOLOGY    ____________________________________________   PROCEDURES  Procedure(s) performed: No  Procedures  Critical Care performed: No ____________________________________________   INITIAL IMPRESSION / ASSESSMENT AND PLAN / ED COURSE  Pertinent labs & imaging results that were available during my care of the patient were reviewed by me and considered in my medical decision making (see chart for details).  64 year old male with PMH as noted above presents with diarrhea over the last 2 days associated with crampy abdominal pain and lightheadedness.  He contacted his PMD today and was instructed to come to the ED  for evaluation.  By the time he was placed in an exam room, the patient had waited in the ED for over 11 hours.  During this time his symptoms have mostly resolved.  He states that the diarrhea has almost completely resolved and he has had only occasional mild abdominal cramping.  He otherwise feels well.  On exam, the patient is well-appearing.  His vital signs are normal except for mild hypertension.  The abdomen is soft and nontender.  Lab work-up obtained from triage this morning is unremarkable, with a normal WBC count and electrolytes.  Overall presentation is most consistent with viral gastroenteritis or other benign etiology.  There is no blood in the stool or evidence of bacterial etiology.  Given the patient's improving symptoms, reassuring labs, and stable vital signs, there is no indication for further work-up.  The patient himself feels much better and is eager to go home.  I counseled him on the results of the work-up.  He is stable for discharge at this time.  I gave him thorough return precautions  and he expressed understanding.  ____________________________________________   FINAL CLINICAL IMPRESSION(S) / ED DIAGNOSES  Final diagnoses:  Diarrhea of presumed infectious origin  Dizziness      NEW MEDICATIONS STARTED DURING THIS VISIT:  Discharge Medication List as of 04/28/2020 10:25 PM       Note:  This document was prepared using Dragon voice recognition software and may include unintentional dictation errors.   Dionne Bucy, MD 04/28/20 2352

## 2020-04-28 NOTE — Discharge Instructions (Addendum)
Return to the ER for new, worsening, or persistent severe diarrhea, blood in the stool, persistent or worsening abdominal pain, fever, vomiting, weakness, dizziness, feeling like you are going to pass out, or any other new or worsening symptoms that concern you.  Follow-up with your regular doctor.

## 2020-04-28 NOTE — ED Triage Notes (Signed)
Pt in via POV, reports severe diarrhea x 3 days w/ abdominal cramping, and developing some dizziness, generlized weakness.  Advised per PCP office to be evaluated here.  Vitals WDL.  NAD noted at this time.

## 2020-04-28 NOTE — Telephone Encounter (Signed)
Symptoms started Thursday with the onset of dizziness. Dizziness continues last Thursday and Friday and began having feeling warm flashes that would come and go. Diarrhea began on Saturday and continues Sunday and today. Pt describes diarrhea as severe (7-10 stools per day). Pt having severe fatigue, dry mouth, and continued dizziness.  Pt c/o of severe mid abdominal cramping. Initially the pain was constant nut now it comes and goes. No vomiting, no blood seen in stool, fever.   No abx use, no foreign travel, no known contacts or spoiled food. Pt too Pepto Bismol over the weekend without effect. Pt stated he is taking in at least 8 liters of water in the past 24 hours. Last voided this am. Pt initially thought his dizziness was from his BP med that had recently had a dose adjustment, but his BP have been 130/70-80's.  Advised pt to have someone drive him to the nearest ED for evaluation. Care advice given and pt verbalized understanding. Advised pt to wear mask and wash hands frequently. Pt going to [facility].   , Reason for Disposition . [1] SEVERE abdominal pain AND [2] age > 60 years  Answer Assessment - Initial Assessment Questions 1. DIARRHEA SEVERITY: "How bad is the diarrhea?" "How many extra stools have you had in the past 24 hours than normal?"    - NO DIARRHEA (SCALE 0)   - MILD (SCALE 1-3): Few loose or mushy BMs; increase of 1-3 stools over normal daily number of stools; mild increase in ostomy output.   -  MODERATE (SCALE 4-7): Increase of 4-6 stools daily over normal; moderate increase in ostomy output. * SEVERE (SCALE 8-10; OR 'WORST POSSIBLE'): Increase of 7 or more stools daily over normal; moderate increase in ostomy output; incontinence.     Severe 7- 10 mid abdomen below belly 2. ONSET: "When did the diarrhea begin?"      Saturday 3. BM CONSISTENCY: "How loose or watery is the diarrhea?"      Watery to  4. VOMITING: "Are you also vomiting?" If Yes, ask: "How many times in the  past 24 hours?"      no 5. ABDOMINAL PAIN: "Are you having any abdominal pain?" If Yes, ask: "What does it feel like?" (e.g., crampy, dull, intermittent, constant)      crampy 6. ABDOMINAL PAIN SEVERITY: If present, ask: "How bad is the pain?"  (e.g., Scale 1-10; mild, moderate, or severe)   - MILD (1-3): doesn't interfere with normal activities, abdomen soft and not tender to touch    - MODERATE (4-7): interferes with normal activities or awakens from sleep, tender to touch    - SEVERE (8-10): excruciating pain, doubled over, unable to do any normal activities       Severe comes and goes 7. ORAL INTAKE: If vomiting, "Have you been able to drink liquids?" "How much fluids have you had in the past 24 hours?"     Yes-8 liters in past 24 hours 8. HYDRATION: "Any signs of dehydration?" (e.g., dry mouth [not just dry lips], too weak to stand, dizziness, new weight loss) "When did you last urinate?"     Dry mouth, dizziness, headache (2/10 to 6/10 , on and off and worse with cough), fatigue and weakness-- this am last void  9. EXPOSURE: "Have you traveled to a foreign country recently?" "Have you been exposed to anyone with diarrhea?" "Could you have eaten any food that was spoiled?"    No- no-no 10. ANTIBIOTIC USE: "Are you taking antibiotics  now or have you taken antibiotics in the past 2 months?"       no 11. OTHER SYMPTOMS: "Do you have any other symptoms?" (e.g., fever, blood in stool)       No-but feel warm at times 12. PREGNANCY: "Is there any chance you are pregnant?" "When was your last menstrual period?"       n/a  Protocols used: DIARRHEA-A-AH

## 2020-04-28 NOTE — ED Notes (Signed)
See triage note, pt sent to ED by PCP for diarrhea, abdominal cramping, dizziness that started on Friday. Denies fevers, vomiting Denies known covid exposure Reports being able to eat and drink ok.  Pt alert and oriented, NAD noted, clear speech.

## 2020-05-12 ENCOUNTER — Ambulatory Visit: Payer: BC Managed Care – PPO | Admitting: Family Medicine

## 2020-11-04 IMAGING — CR DG CHEST 2V
1 series · 2 of 2 positions shown · non-contrast
Comparison: None.

CLINICAL DATA: Hypertension. No chest complaints.

EXAM:
CHEST - 2 VIEW

[Series 1: dg chest 2 view · 0.14mm/px · 2 of 2 slices shown]
[im 1/2]
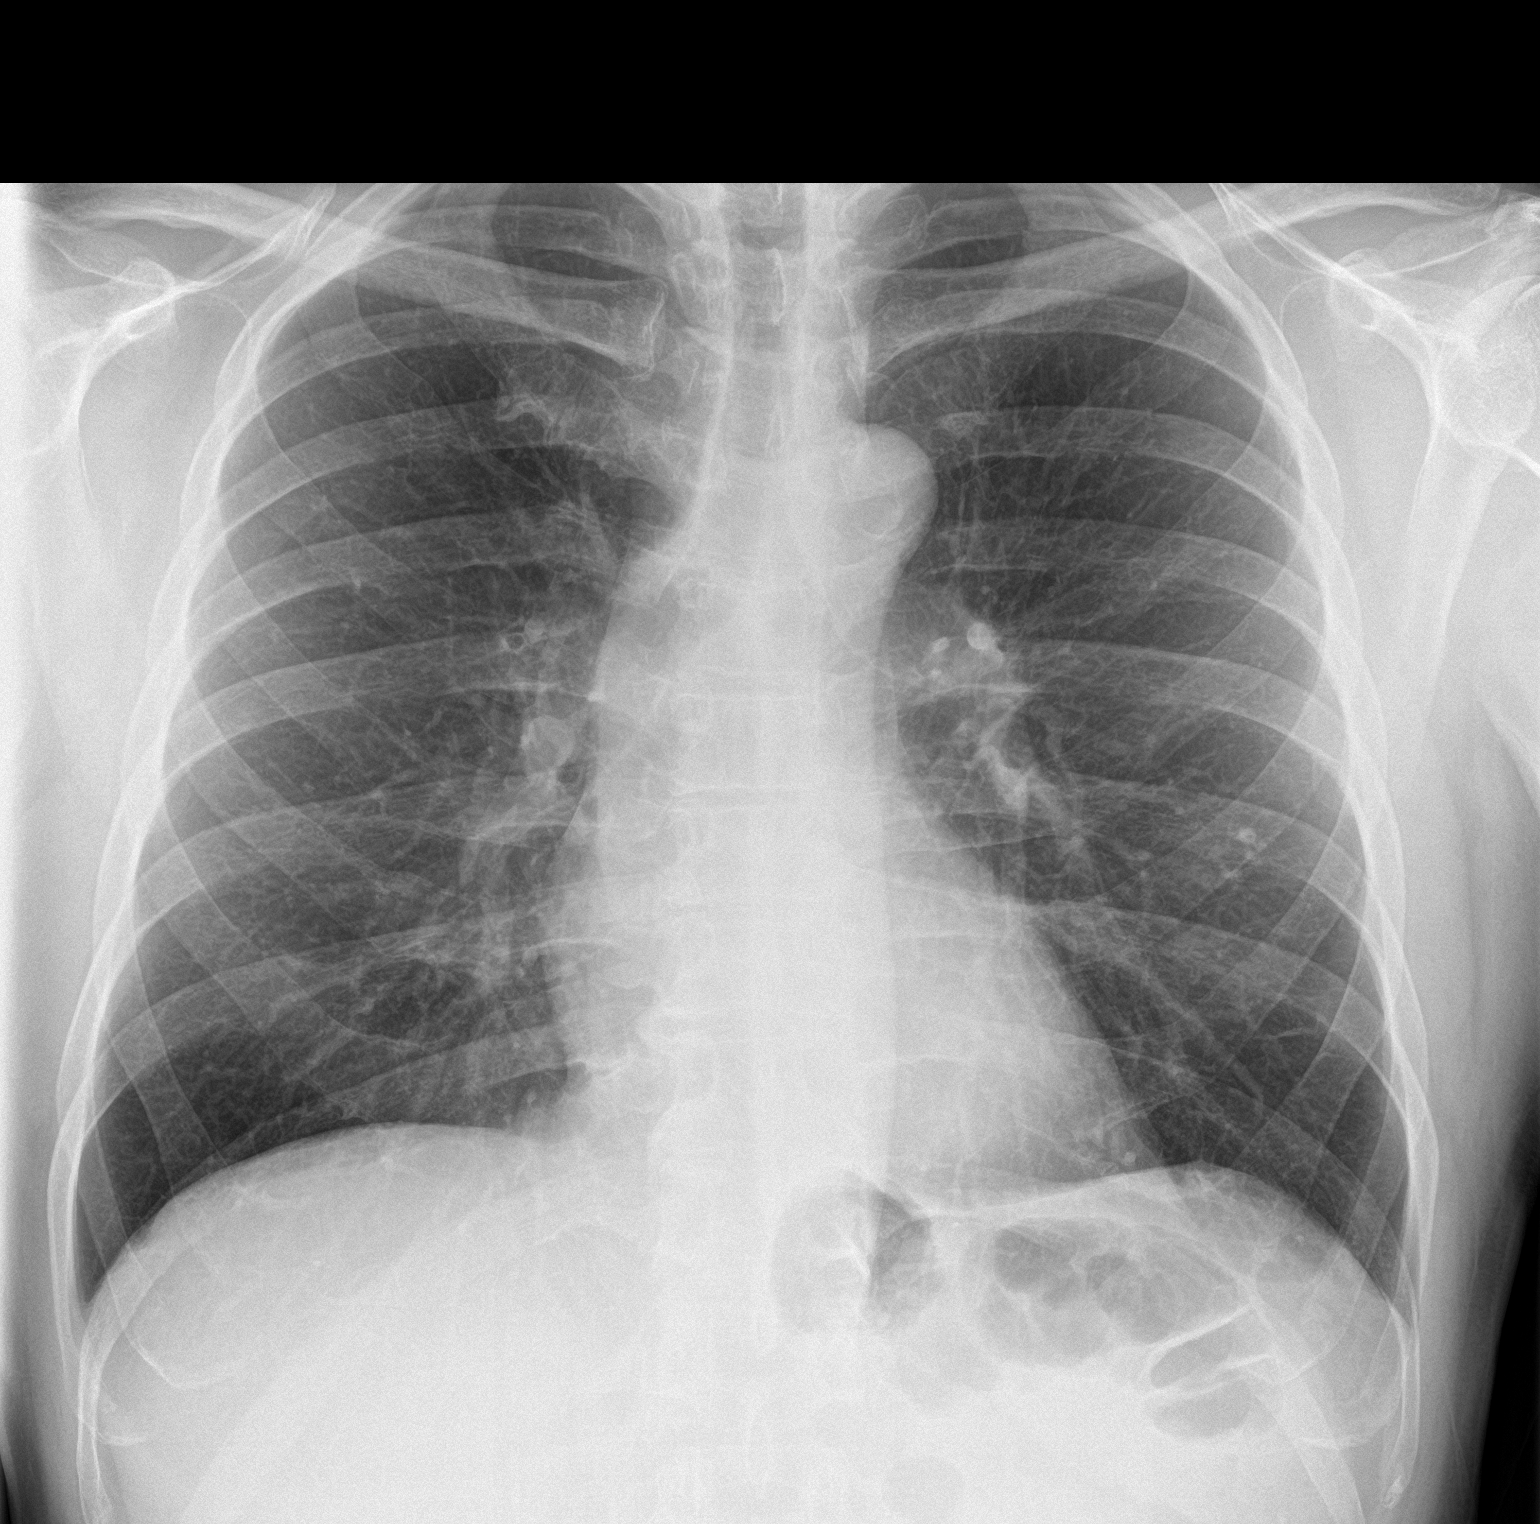
[im 2/2]
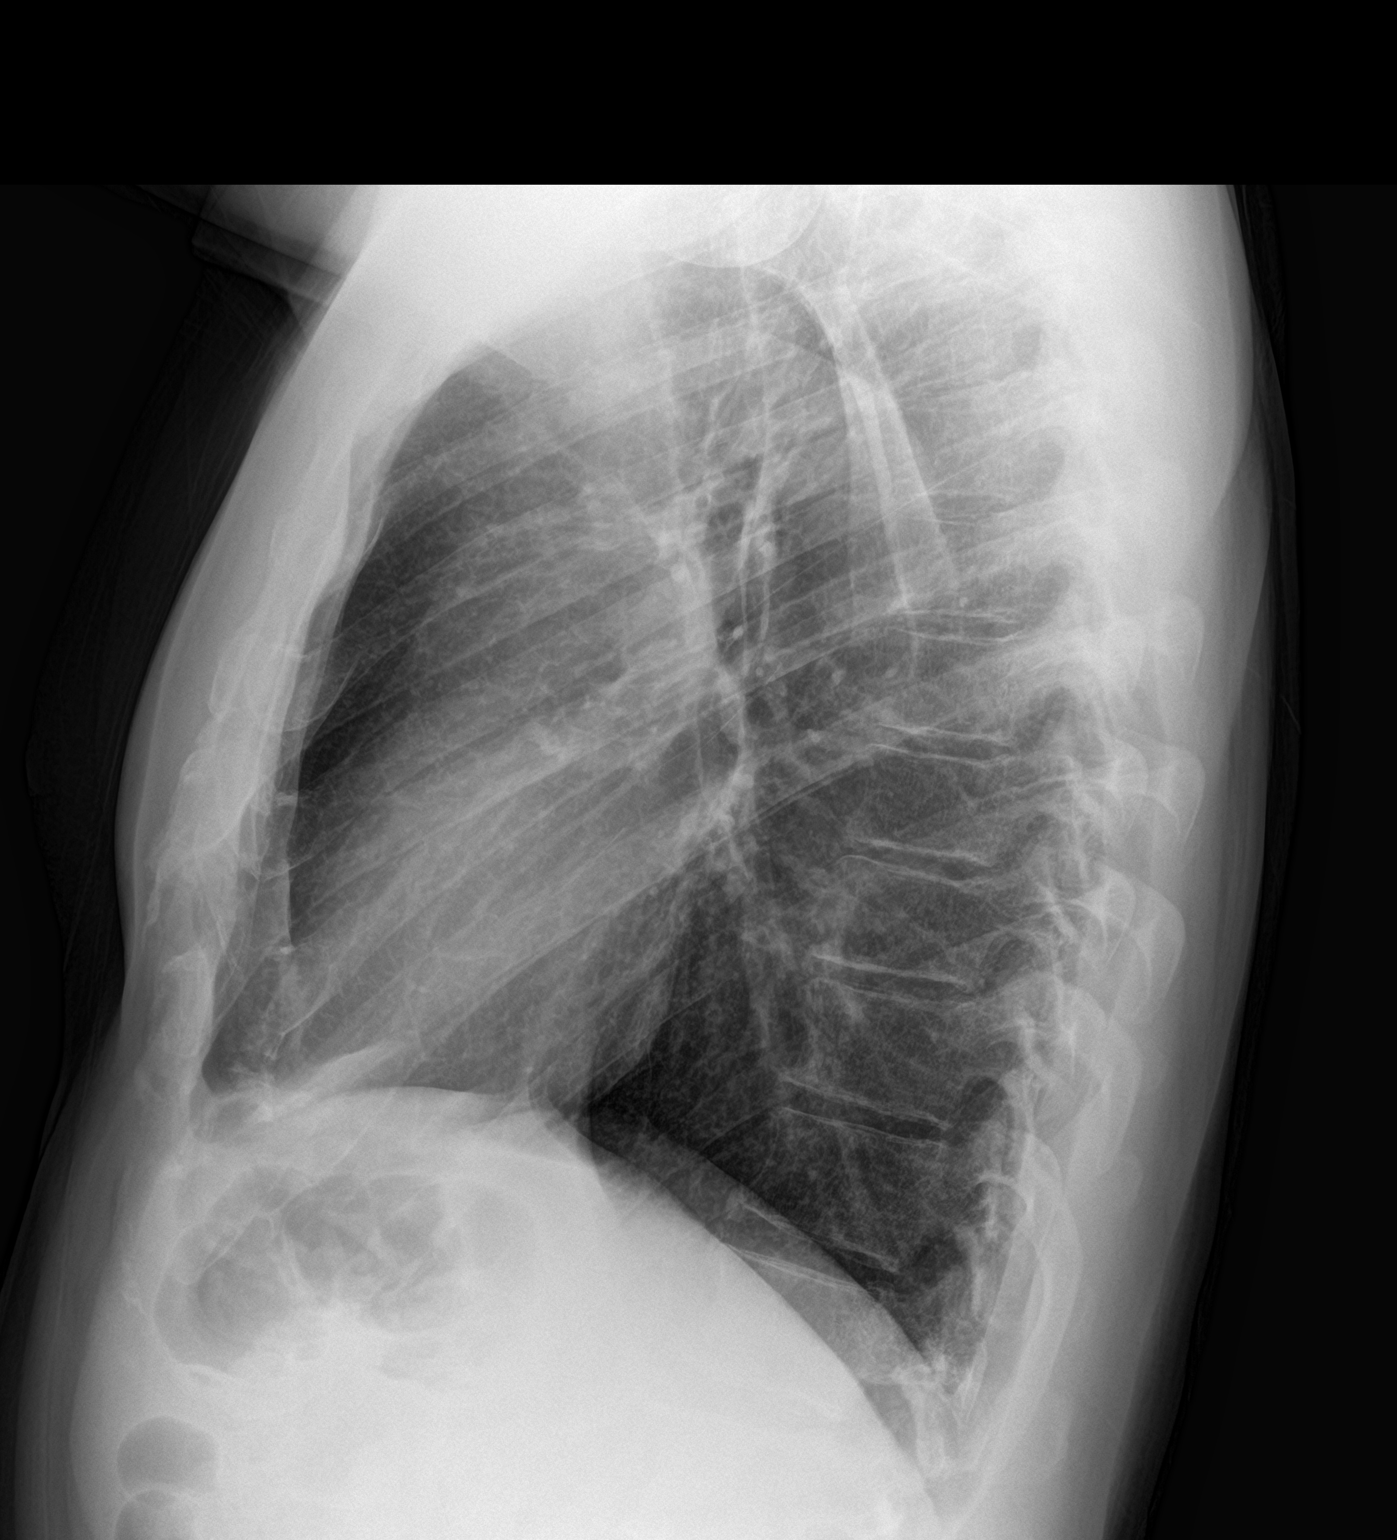

[2 of 2 positions shown; findings below may reference images not displayed]

FINDINGS: The heart size and mediastinal contours are within normal limits.
There is a 0.9 cm nodule in the medial left upper lung. There are a
few small linear opacities at the left lung base likely scarring or
atelectasis. No focal consolidation. No pneumothorax or pleural
effusion. The visualized skeletal structures are unremarkable.
IMPRESSION: 1. There is a 0.9 cm nodule in the left upper lung. Recommend
nonemergent chest CT for further evaluation.

2.  Minimal scarring/atelectasis at the left lung base.

## 2020-12-02 IMAGING — CR DG ABDOMEN 1V
1 series · 2 of 2 positions shown · non-contrast
Comparison: CT abdomen and pelvis 0 10/23/2019.  KUB 10/25/2019.

CLINICAL DATA: Patient status post lithotripsy on the left
10/25/2019.

EXAM:
ABDOMEN - 1 VIEW

[Series 1: dg abd 1 view · 0.14mm/px · 2 of 2 slices shown]
[im 1/2]
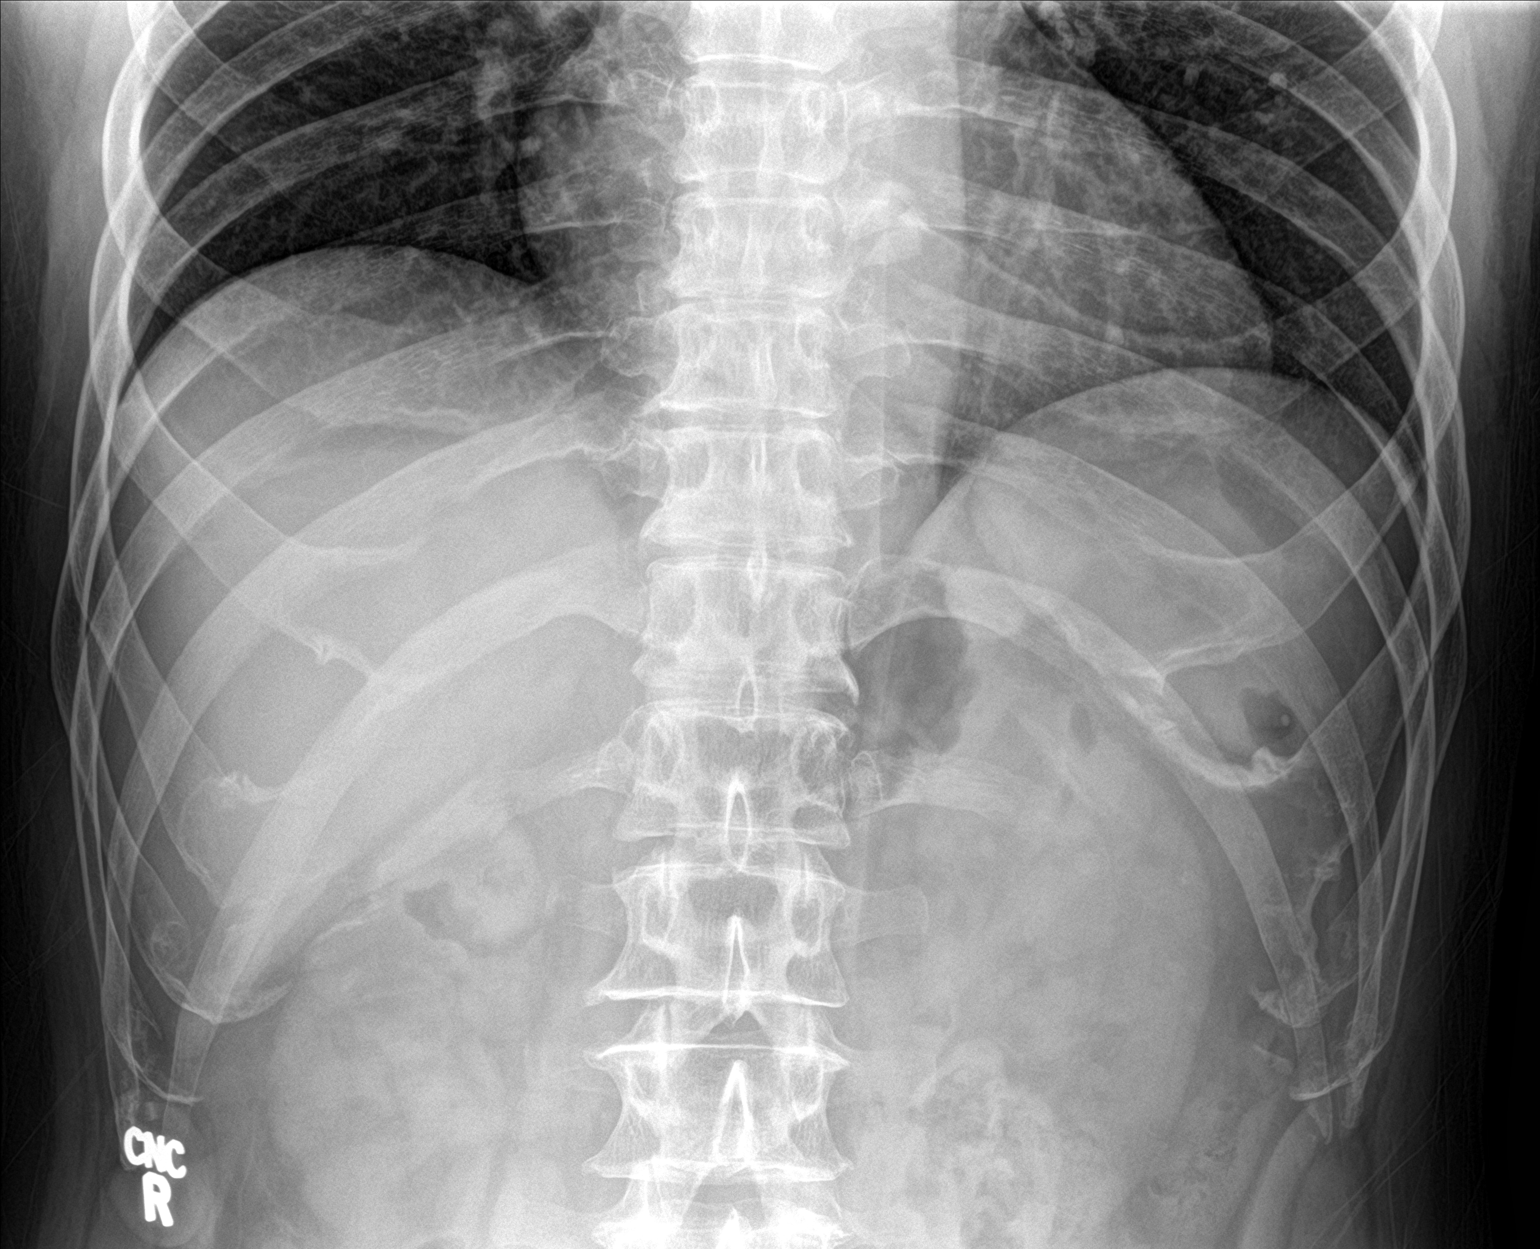
[im 2/2]
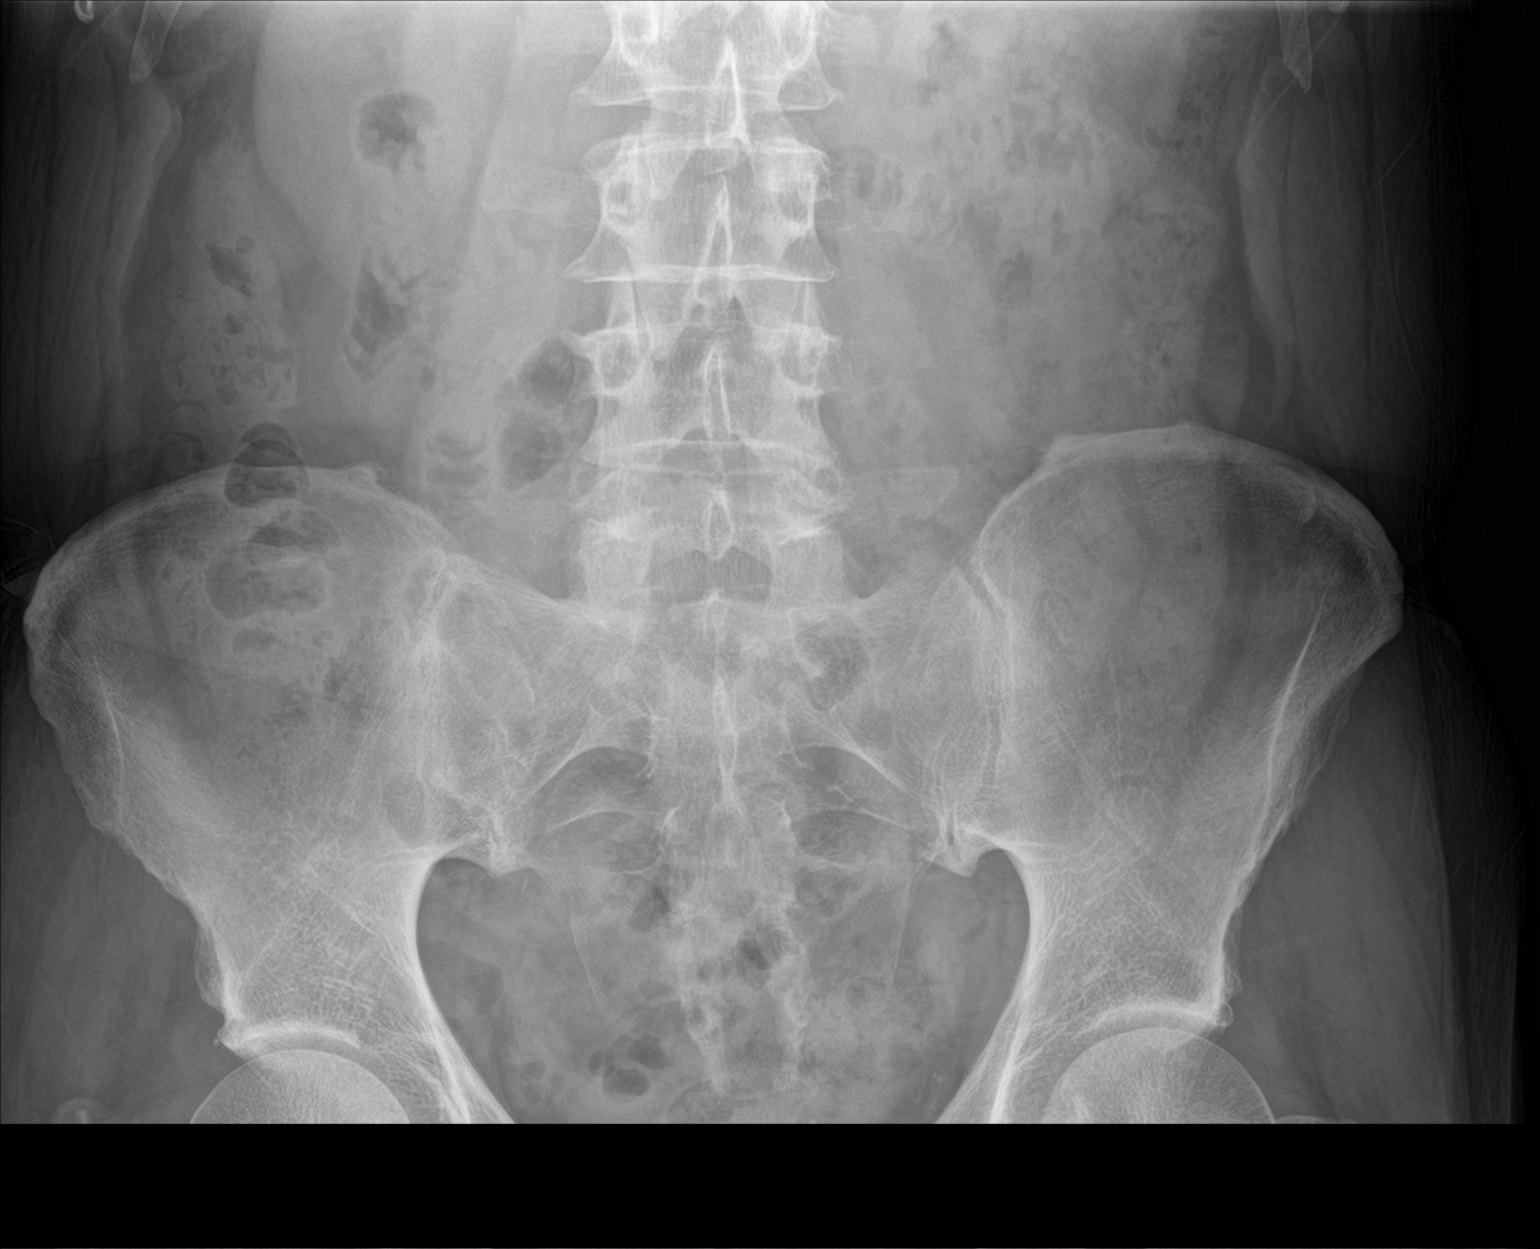

[2 of 2 positions shown; findings below may reference images not displayed]

FINDINGS: Previously seen stone at the left ureterovesical junction is no
longer present. No urinary tract stones are visible. Bowel gas
pattern is normal.
IMPRESSION: Previously seen left UVJ stone is no longer present. No urinary
tract stones are identified.

## 2020-12-03 ENCOUNTER — Other Ambulatory Visit: Payer: Self-pay

## 2020-12-03 ENCOUNTER — Ambulatory Visit: Payer: BC Managed Care – PPO | Admitting: Family Medicine

## 2020-12-03 ENCOUNTER — Encounter: Payer: Self-pay | Admitting: Family Medicine

## 2020-12-03 VITALS — BP 116/72 | HR 81 | Temp 98.0°F | Resp 16 | Ht 75.0 in | Wt 205.3 lb

## 2020-12-03 DIAGNOSIS — R1030 Lower abdominal pain, unspecified: Secondary | ICD-10-CM

## 2020-12-03 DIAGNOSIS — Z8249 Family history of ischemic heart disease and other diseases of the circulatory system: Secondary | ICD-10-CM

## 2020-12-03 DIAGNOSIS — R109 Unspecified abdominal pain: Secondary | ICD-10-CM

## 2020-12-03 DIAGNOSIS — E785 Hyperlipidemia, unspecified: Secondary | ICD-10-CM | POA: Diagnosis not present

## 2020-12-03 DIAGNOSIS — I10 Essential (primary) hypertension: Secondary | ICD-10-CM

## 2020-12-03 DIAGNOSIS — N529 Male erectile dysfunction, unspecified: Secondary | ICD-10-CM

## 2020-12-03 DIAGNOSIS — N400 Enlarged prostate without lower urinary tract symptoms: Secondary | ICD-10-CM

## 2020-12-03 LAB — POCT URINALYSIS DIPSTICK
Bilirubin, UA: NEGATIVE
Blood, UA: NEGATIVE
Glucose, UA: NEGATIVE
Ketones, UA: NEGATIVE
Leukocytes, UA: NEGATIVE
Nitrite, UA: NEGATIVE
Odor: NORMAL
Protein, UA: NEGATIVE
Spec Grav, UA: 1.015 (ref 1.010–1.025)
Urobilinogen, UA: 0.2 E.U./dL
pH, UA: 6.5 (ref 5.0–8.0)

## 2020-12-03 IMAGING — CT CT CHEST W/O CM
2 of 4 series · 15 of 36 positions shown, 18 images · non-contrast
Comparison: Radiograph 10/11/2019

CLINICAL DATA: Lung nodule seen on chest radiograph CT contrast

EXAM:
CT CHEST WITHOUT CONTRAST
TECHNIQUE: Multidetector CT imaging of the chest was performed following the
standard protocol without IV contrast.

[Series 2: chest 2.00 · axial · 0.78mm/px · z∈[-1232,-936]mm · 12 of 176 slices shown, 15 images]
[im 14/176  mediastinal]
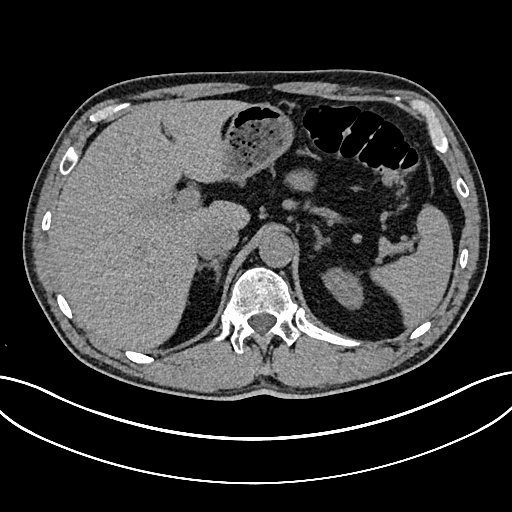
[im 14/176  lung]
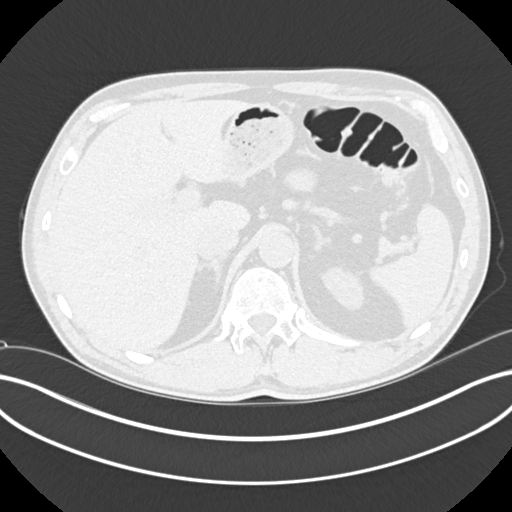
[im 27/176  lung]
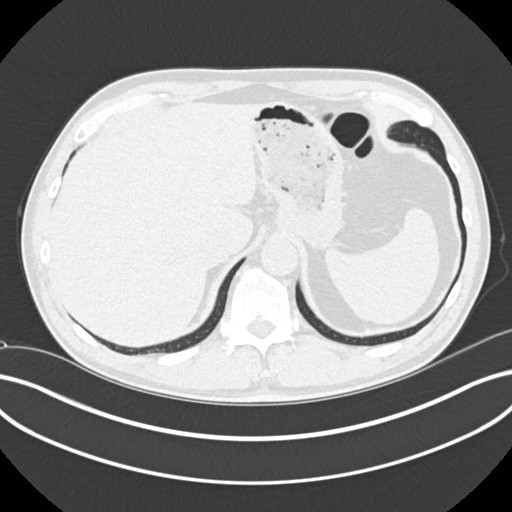
[im 41/176  lung]
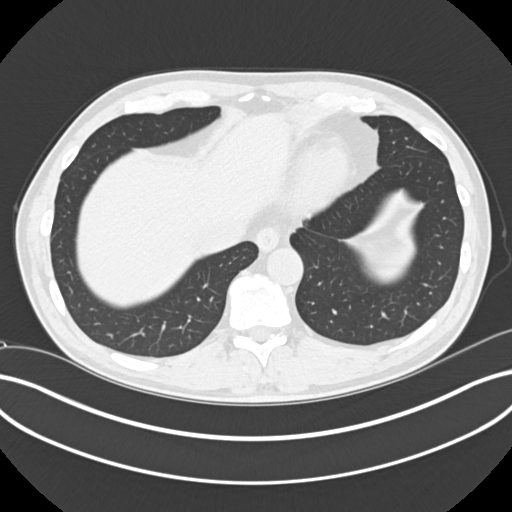
[im 54/176  lung]
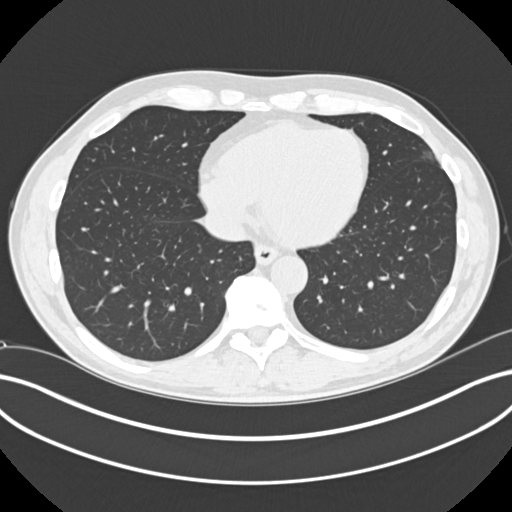
[im 68/176  mediastinal]
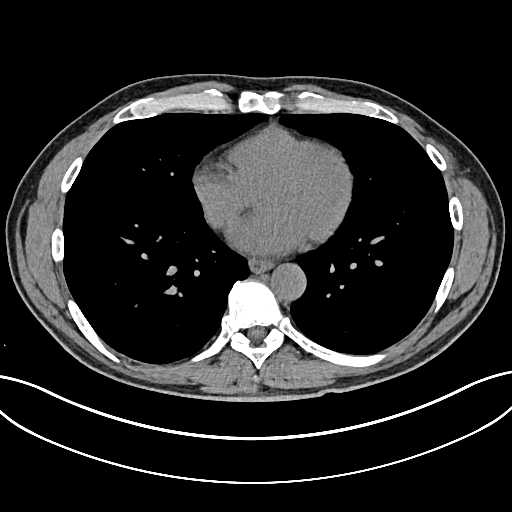
[im 68/176  lung]
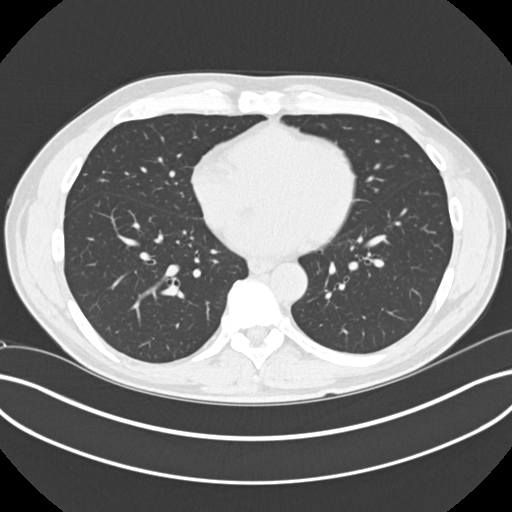
[im 81/176  lung]
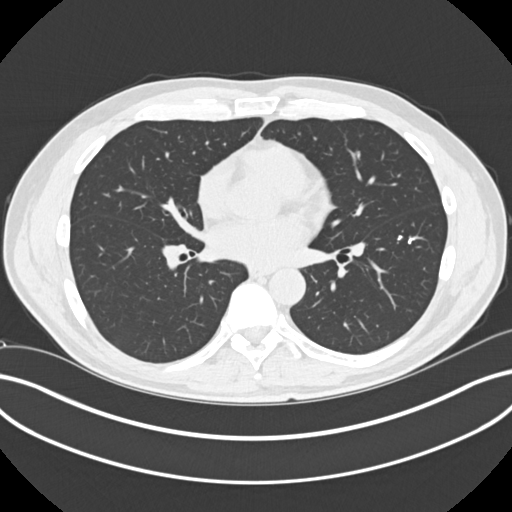
[im 95/176  lung]
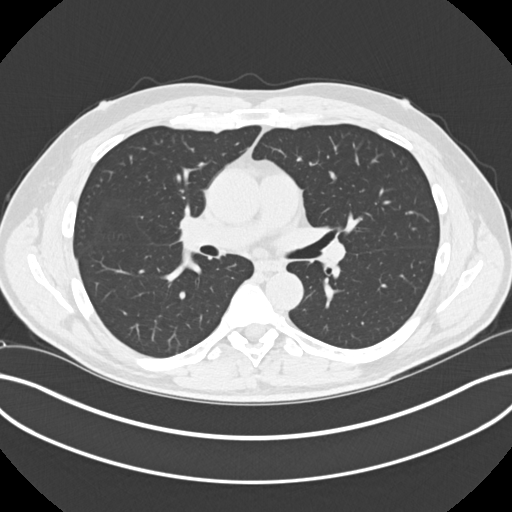
[im 108/176  lung]
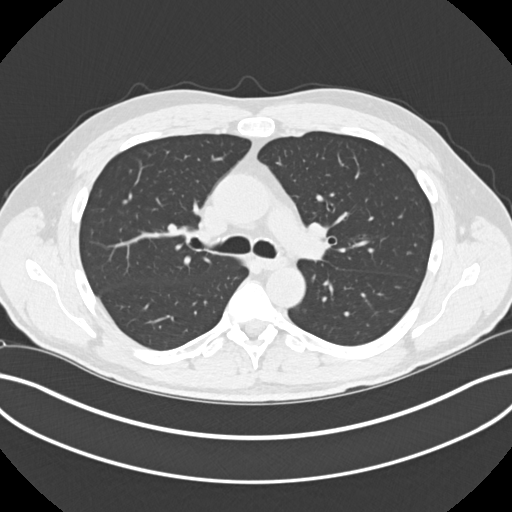
[im 122/176  mediastinal]
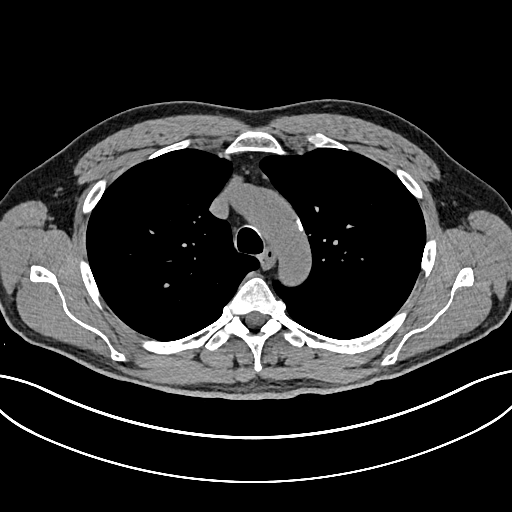
[im 122/176  lung]
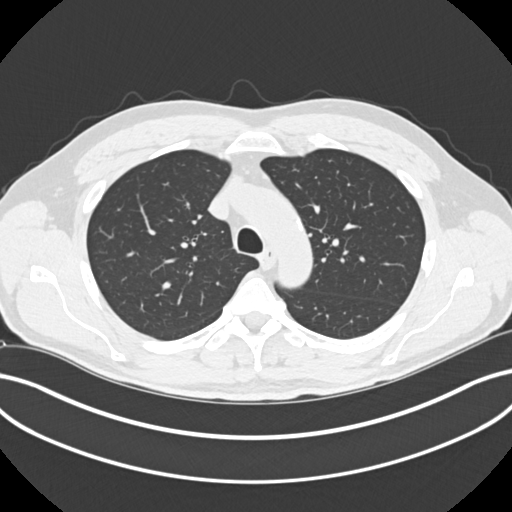
[im 135/176  lung]
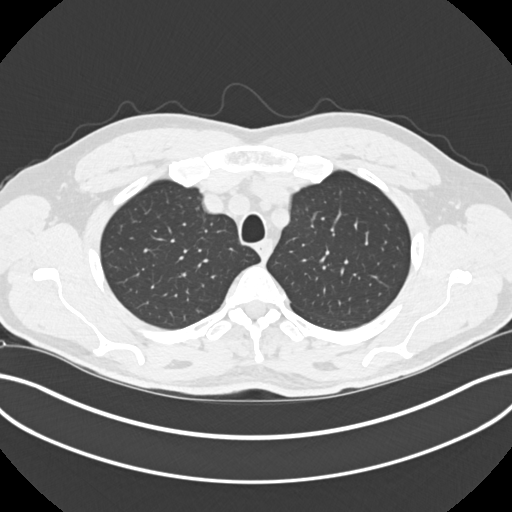
[im 149/176  lung]
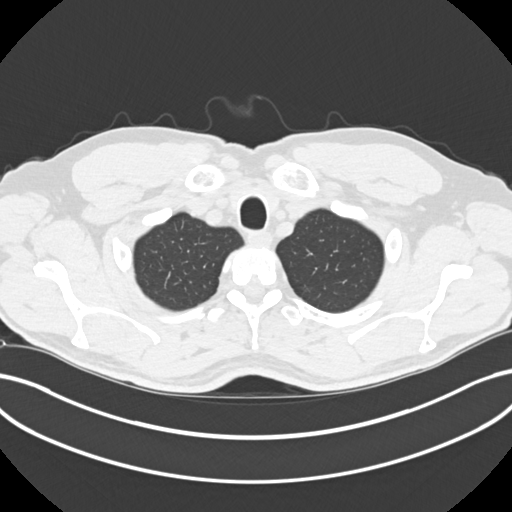
[im 162/176  lung]
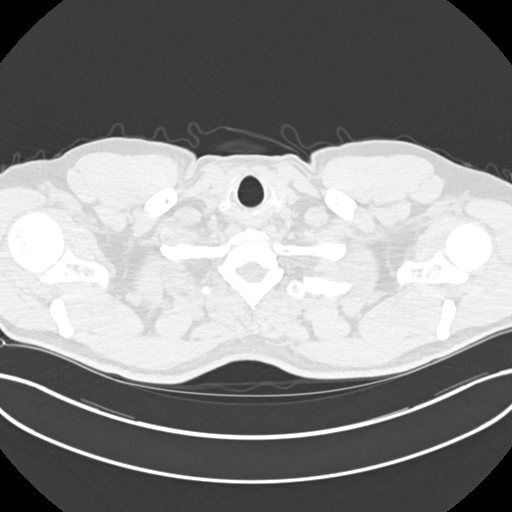

[Series 5: coronals chest 2.00 cor · coronal · 0.69mm/px · 3 of 198 slices shown]
[im 40/198  lung]
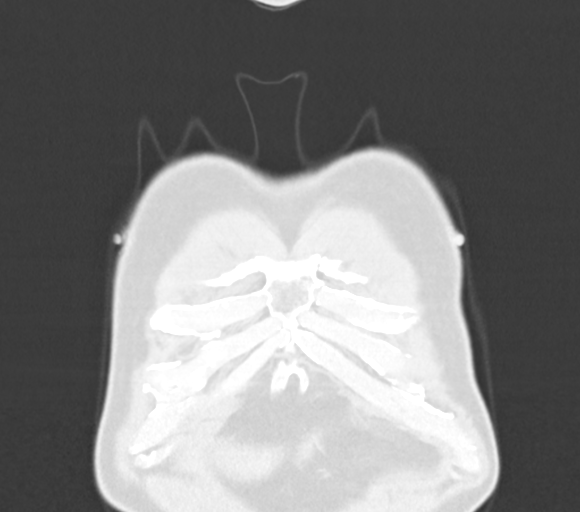
[im 79/198  lung]
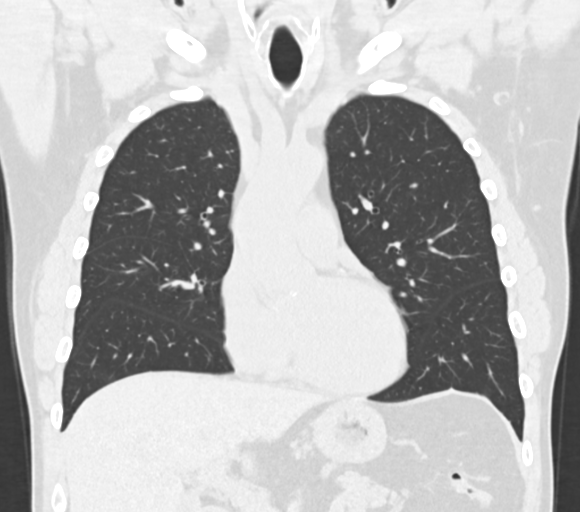
[im 119/198  lung]
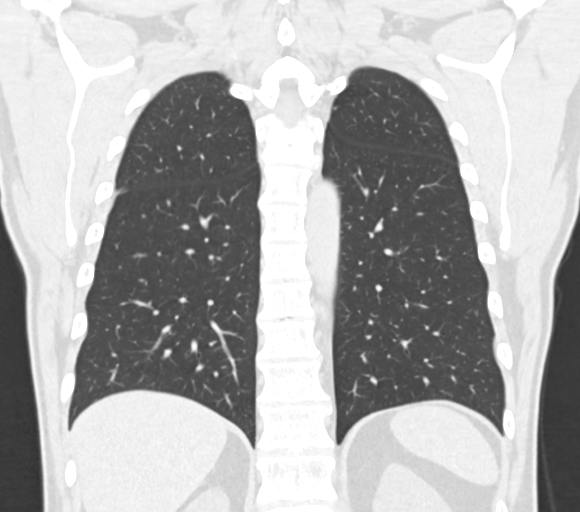

[15 of 36 positions shown; findings below may reference images not displayed]

FINDINGS: Cardiovascular: Normal heart size. No pericardial effusion. Few
coronary artery calcifications. Atherosclerotic plaque within the
normal caliber aorta. No aneurysm or ectasia. Normal 3 vessel
branching of the aortic arch. Proximal great vessels are
unremarkable. Central pulmonary arteries are normal caliber. Luminal
evaluation precluded in the absence of contrast.

Mediastinum/Nodes: No mediastinal fluid or gas. Normal thyroid gland
and thoracic inlet. No acute abnormality of the trachea or
esophagus. No worrisome mediastinal or axillary adenopathy. Few
calcified left hilar nodes are present (2/88). Additional hilar
nodal evaluation is limited in the absence of intravenous contrast
media.

Lungs/Pleura: Few clustered calcified nodules present in the
superior segment left lower lobe. No worrisome nodules or masses to
correspond to the abnormality seen on radiograph. No consolidation,
features of edema, pneumothorax, or effusion.

Upper Abdomen: No acute abnormalities present in the visualized
portions of the upper abdomen. Punctate calcifications in the spleen
likely reflect additional calcified granulomata.

Musculoskeletal: Multilevel degenerative changes are present in the
imaged portions of the spine. No acute osseous abnormality or
suspicious osseous lesion.
IMPRESSION: No CT correlate to the nodular opacity seen on comparison
radiograph. Finding may have reflected superimposed osseous and soft
tissues.

Calcified hilar nodes, few calcified pulmonary nodules and few
punctate calcifications in the spleen likely reflecting sequela of
remote granulomatous disease/granulomata.

Few coronary artery calcifications.

Aortic Atherosclerosis ([id number]).

## 2020-12-03 MED ORDER — TADALAFIL 5 MG PO TABS: 5.0000 mg | ORAL_TABLET | Freq: Every day | ORAL | 2 refills | Status: DC | PRN

## 2020-12-03 NOTE — Progress Notes (Signed)
Patient ID: Douglas Heath, male    DOB: [birth year], 65 y.o.   MRN: [id number]  PCP: Danelle Berry, PA-C  Chief Complaint  Patient presents with  . Abdominal Pain    Possible pulled muscle while doing yard work    Subjective:   Douglas Heath is a 65 y.o. male, presents to clinic with CC of the following:  HPI  Here for abd pain? Trunk msk pain? Patient reports low suprapubic groin area pain that is occurred 2 different times following strenuous activity when doing yard work.  He has not felt a bulge or mass and he has not noticed any scrotal swelling, denies any urinary symptoms such as hematuria, urgency, frequency.  He previously had a kidney stone and he does not have any symptoms similar to what he experienced around that time.  No changes in appetite, bowels.  PT has questions about ED meds, he has tried Viagra and Cialis in the past, he has history of Peyronie's and his wife has some dyspareunia he can obtain an erection but he cannot sustain it  Hypertension:  Currently managed on lisinopril-HCTZ 20-12 0.5 daily BP Readings from Last 3 Encounters:  12/03/20 116/72  04/28/20 (!) 150/96  03/27/20 (!) 152/94   Pt denies CP, SOB, exertional sx, LE edema, palpitation, Ha's, visual disturbances, lightheadedness, hypotension, syncope.      Patient Active Problem List   Diagnosis Date Noted  . Enlarged prostate 10/23/2019  . Left nephrolithiasis 10/23/2019  . Sigmoid diverticulosis 10/23/2019  . Hypertension 10/15/2019  . Family history of cardiac disorder in father 10/15/2019      Current Outpatient Medications:  .  lisinopril-hydrochlorothiazide (ZESTORETIC) 20-12.5 MG tablet, Take 1 tablet by mouth daily., Disp: 90 tablet, Rfl: 3  Current Facility-Administered Medications:  .  sodium chloride (PF) 0.9 % injection 100 mL, 100 mL, Intravenous, Once, [name], MD   No Known Allergies   Social History   Tobacco Use  . Smoking status: Never Smoker  . Smokeless  tobacco: Former Neurosurgeon    Types: Snuff  Vaping Use  . Vaping Use: Never used  Substance Use Topics  . Alcohol use: Yes    Alcohol/week: 28.0 standard drinks    Types: 28 Shots of liquor per week  . Drug use: Never      Chart Review Today: I personally reviewed active problem list, medication list, allergies, family history, social history, health maintenance, notes from last encounter, lab results, imaging with the patient/caregiver today.   Review of Systems  Constitutional: Negative.  Negative for activity change, appetite change, chills, diaphoresis, fatigue, fever and unexpected weight change.  HENT: Negative.   Eyes: Negative.   Respiratory: Negative.   Cardiovascular: Negative.   Gastrointestinal: Negative.   Endocrine: Negative.   Genitourinary: Negative.  Negative for decreased urine volume, difficulty urinating, dysuria, enuresis, flank pain, frequency, genital sores, hematuria, penile discharge, penile pain, penile swelling, scrotal swelling, testicular pain and urgency.  Musculoskeletal: Negative.  Negative for back pain and joint swelling.  Skin: Negative.   Allergic/Immunologic: Negative.   Neurological: Negative.   Hematological: Negative.   Psychiatric/Behavioral: Negative.   All other systems reviewed and are negative.  10 Systems reviewed and are negative for acute change except as noted in the HPI.     Objective:   Vitals:   12/03/20 1443  BP: 116/72  Pulse: 81  Resp: 16  Temp: 98 F (36.7 C)  SpO2: 98%  Weight: 205 lb 4.8 oz (93.1 kg)  Height:  6\' 3"  (1.905 m)    Body mass index is 25.66 kg/m.  Physical Exam Vitals and nursing note reviewed. Exam conducted with a chaperone present.  Constitutional:      General: He is not in acute distress.    Appearance: Normal appearance. He is well-developed and normal weight. He is not ill-appearing, toxic-appearing or diaphoretic.     Interventions: Face mask in place.  HENT:     Head: Normocephalic and  atraumatic.     Jaw: No trismus.     Right Ear: External ear normal.     Left Ear: External ear normal.  Eyes:     General: Lids are normal. No scleral icterus.       Right eye: No discharge.        Left eye: No discharge.     Conjunctiva/sclera: Conjunctivae normal.  Neck:     Trachea: Trachea and phonation normal. No tracheal deviation.  Cardiovascular:     Rate and Rhythm: Normal rate and regular rhythm.     Pulses: Normal pulses.          Radial pulses are 2+ on the right side and 2+ on the left side.       Posterior tibial pulses are 2+ on the right side and 2+ on the left side.     Heart sounds: Normal heart sounds. No murmur heard. No friction rub. No gallop.   Pulmonary:     Effort: Pulmonary effort is normal. No respiratory distress.     Breath sounds: Normal breath sounds. No stridor. No wheezing, rhonchi or rales.  Abdominal:     General: Abdomen is flat. Bowel sounds are normal. There is no distension.     Palpations: Abdomen is soft.     Tenderness: There is no abdominal tenderness. There is no right CVA tenderness or left CVA tenderness. Negative signs include McBurney's sign.     Hernia: A hernia is present. Hernia is present in the left inguinal area (possibly indirect hernia, no obvious bulge or mass). There is no hernia in the umbilical area, ventral area, right femoral area, left femoral area or right inguinal area.  Genitourinary:    Penis: Normal.      Testes: Normal.        Right: Mass, tenderness or swelling not present.        Left: Mass, tenderness or swelling not present.     Epididymis:     Right: Normal. Not enlarged. No mass or tenderness.     Left: Normal. Not enlarged. No mass or tenderness.  Musculoskeletal:     Right lower leg: No edema.     Left lower leg: No edema.  Lymphadenopathy:     Lower Body: No right inguinal adenopathy. No left inguinal adenopathy.  Skin:    General: Skin is warm and dry.     Coloration: Skin is not jaundiced.      Findings: No rash.     Nails: There is no clubbing.  Neurological:     Mental Status: He is alert. Mental status is at baseline.     Cranial Nerves: No dysarthria or facial asymmetry.     Motor: No tremor or abnormal muscle tone.     Gait: Gait normal.  Psychiatric:        Mood and Affect: Mood normal.        Speech: Speech normal.        Behavior: Behavior normal. Behavior is cooperative.  Results for orders placed or performed during the hospital encounter of 04/28/20  Lipase, blood  Result Value Ref Range   Lipase 26 11 - 51 U/L  Comprehensive metabolic panel  Result Value Ref Range   Sodium 136 135 - 145 mmol/L   Potassium 3.8 3.5 - 5.1 mmol/L   Chloride 101 98 - 111 mmol/L   CO2 27 22 - 32 mmol/L   Glucose, Bld 110 (H) 70 - 99 mg/dL   BUN 16 8 - 23 mg/dL   Creatinine, Ser 1.610.96 0.61 - 1.24 mg/dL   Calcium 9.1 8.9 - 09.610.3 mg/dL   Total Protein 7.4 6.5 - 8.1 g/dL   Albumin 4.2 3.5 - 5.0 g/dL   AST 35 15 - 41 U/L   ALT 47 (H) 0 - 44 U/L   Alkaline Phosphatase 74 38 - 126 U/L   Total Bilirubin 1.2 0.3 - 1.2 mg/dL   GFR calc non Af Amer >60 >60 mL/min   GFR calc Af Amer >60 >60 mL/min   Anion gap 8 5 - 15  CBC  Result Value Ref Range   WBC 6.3 4.0 - 10.5 K/uL   RBC 5.05 4.22 - 5.81 MIL/uL   Hemoglobin 16.0 13.0 - 17.0 g/dL   HCT 04.545.3 40.939.0 - 81.152.0 %   MCV 89.7 80.0 - 100.0 fL   MCH 31.7 26.0 - 34.0 pg   MCHC 35.3 30.0 - 36.0 g/dL   RDW 91.412.9 78.211.5 - 95.615.5 %   Platelets 125 (L) 150 - 400 K/uL   nRBC 0.0 0.0 - 0.2 %  Urinalysis, Complete w Microscopic  Result Value Ref Range   Color, Urine STRAW (A) YELLOW   APPearance CLEAR (A) CLEAR   Specific Gravity, Urine 1.003 (L) 1.005 - 1.030   pH 6.0 5.0 - 8.0   Glucose, UA NEGATIVE NEGATIVE mg/dL   Hgb urine dipstick SMALL (A) NEGATIVE   Bilirubin Urine NEGATIVE NEGATIVE   Ketones, ur 5 (A) NEGATIVE mg/dL   Protein, ur NEGATIVE NEGATIVE mg/dL   Nitrite NEGATIVE NEGATIVE   Leukocytes,Ua NEGATIVE NEGATIVE   RBC / HPF  0-5 0 - 5 RBC/hpf   WBC, UA 0-5 0 - 5 WBC/hpf   Bacteria, UA NONE SEEN NONE SEEN   Squamous Epithelial / LPF NONE SEEN 0 - 5       Assessment & Plan:     ICD-10-CM   1. Hypertension, unspecified type  I10    Well-controlled with improved diet and lifestyle efforts and lisinopril HCTZ he does not want to labs today he is due for labs  2. Hyperlipidemia, unspecified hyperlipidemia type  E78.5    Same as above due for labs, not currently on statin, has family history of cardiovascular disease encourage strongly he return for labs  3. Abdominal wall pain  R10.9 DG Abd 1 View    POCT urinalysis dipstick   low abd pelvic pain after doing a lot of yard work - no bowel changes, no urinary changes   4. Inguinal pain, unspecified laterality  R10.30 DG Abd 1 View    POCT urinalysis dipstick   two episodes after long days of heavy physical activity   5. Erectile dysfunction, unspecified erectile dysfunction type  N52.9 tadalafil (CIALIS) 5 MG tablet  6. Family history of cardiac disorder in father  48Z82.49   7. Enlarged prostate  N40.0    Patient's pain today is slightly unusual 2 episodes of low abdominal and groin pain after very long days working in  his yard doing strenuous activity, which seem very unusual to be a musculoskeletal strain in that suprapubic area could possibly be a indirect inguinal hernia after strenuous work that is irritated or slightly inflamed, he did not want to do any imaging today for further evaluation of low abdomen pelvic or groin area.  I explained we could do a oral contrasted CT or she could see a general surgeon however it was not 100% confident that he had a possible hernia there.  He has had atypical presentation with things in the past including male textbook symptoms consistent with a very large kidney stone which he had last year  He refused to labs today  Encouraged him to follow-up if he felt any bulge, mass or had worsening symptoms, urinary symptoms, bowel  symptoms etc. KUB ordered to use assess gas and bowel pattern and see if there could possibly be a kidney stone related to his current symptoms?  Urine sample provided before leaving today, UA was unremarkable  He asked for prescription of Cialis when leaving the exam room today as well he stated he tried Viagra and Cialis in the past Viagra did not work for him and Cialis did work for him he had no adverse side effects concerns he is emphatic that he has no chest pain shortness of breath near syncope or cardiac symptoms or exertional symptoms with very strenuous physical activity.  He has seen urology in the past for nephrolithiasis and Peyronie's disease I do not believe he is established with a local urologist regarding enlarged prostate or ED.  Strongly encouraged him to follow-up to recheck his cholesterol, labs related to blood pressure, last year was reviewed to follow-up and repeat his EKG when his blood pressure improved he does have significant family history and I have offered multiple times for him to consult with a cardiologist for basic assessment.       Danelle Berry, PA-C 12/03/20 3:02 PM

## 2020-12-04 ENCOUNTER — Encounter: Payer: Self-pay | Admitting: Family Medicine

## 2020-12-04 DIAGNOSIS — E785 Hyperlipidemia, unspecified: Secondary | ICD-10-CM | POA: Insufficient documentation

## 2020-12-04 DIAGNOSIS — N529 Male erectile dysfunction, unspecified: Secondary | ICD-10-CM | POA: Insufficient documentation

## 2021-01-10 IMAGING — US US RENAL
1 series · 14 of 25 positions shown · non-contrast
Comparison: None.

CLINICAL DATA: Left ureteral stone.  Status post lithotripsy.

EXAM:
RENAL / URINARY TRACT ULTRASOUND COMPLETE

[Series 1: us renal · 0.25mm/px · 14 of 43 slices shown]
[im 1/43]
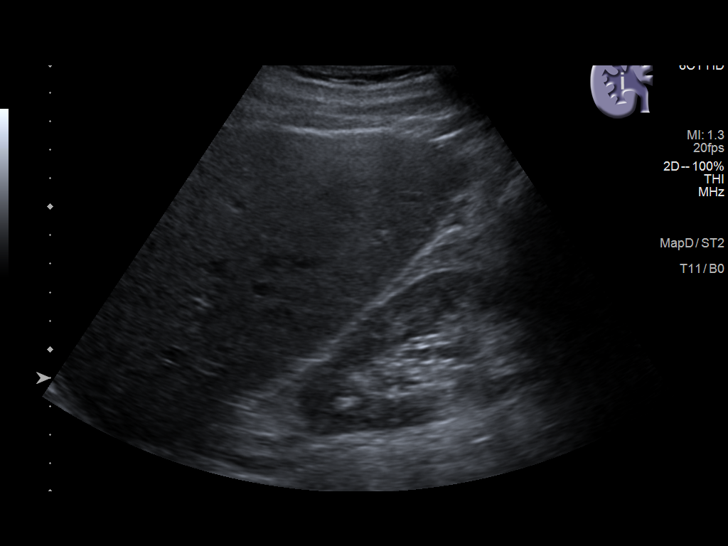
[im 4/43]
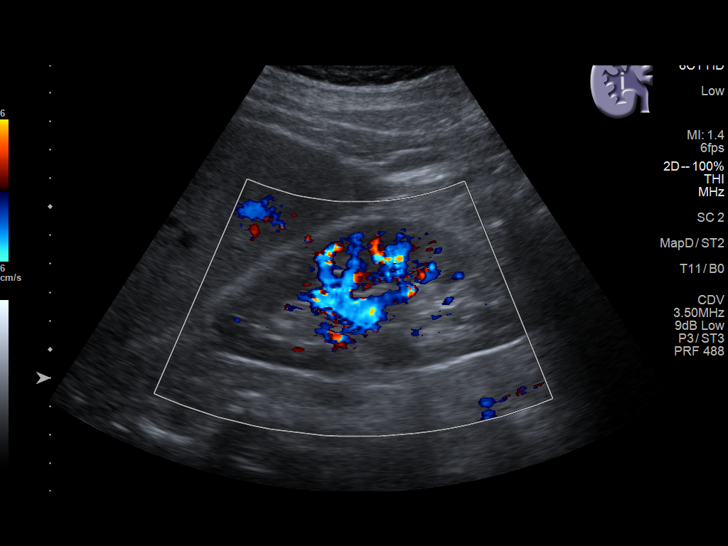
[im 8/43]
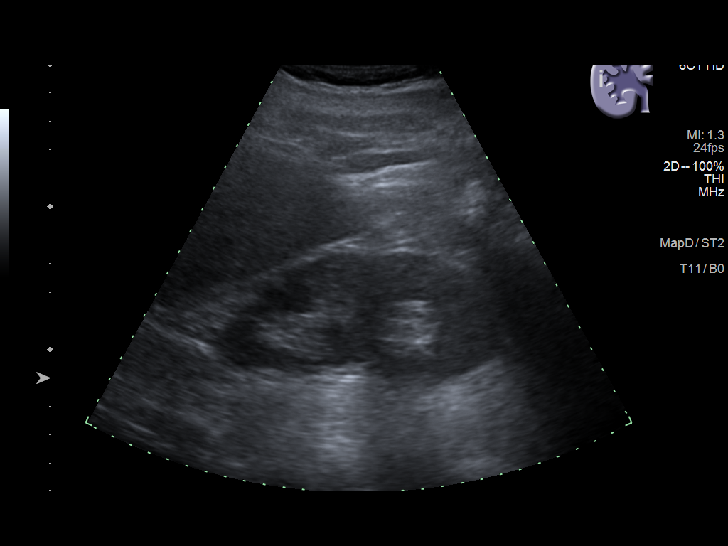
[im 11/43]
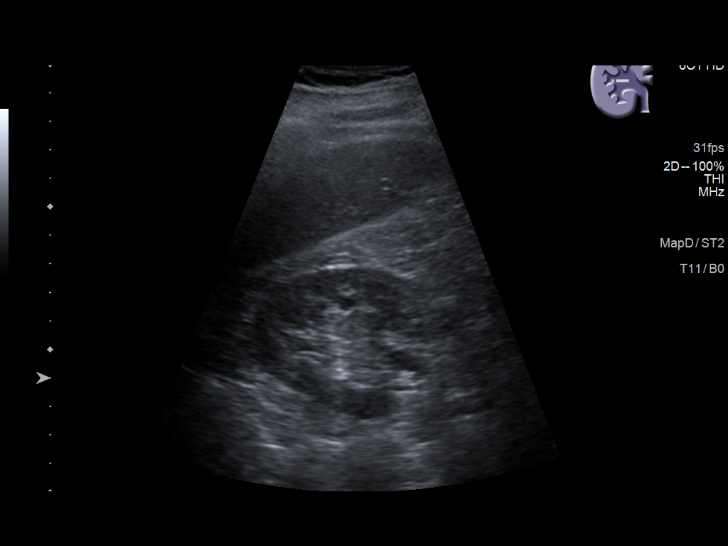
[im 15/43]
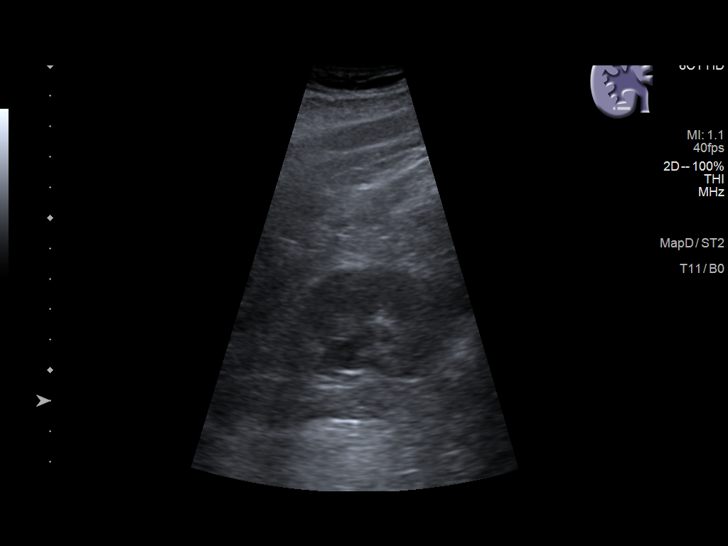
[im 16/43]
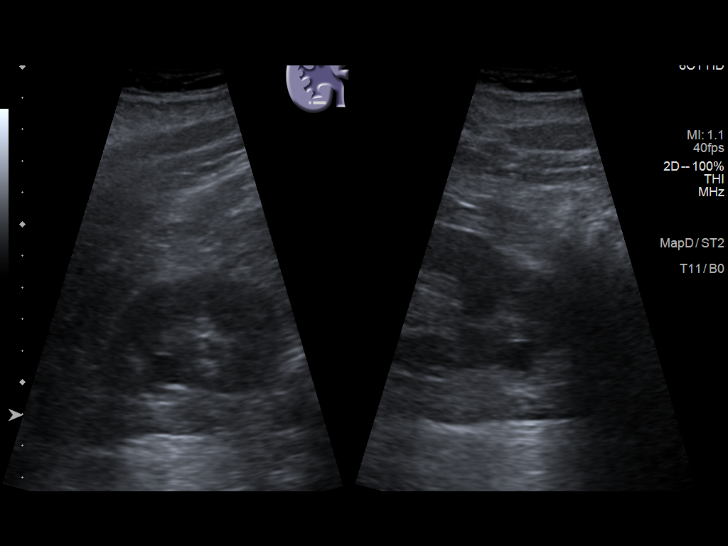
[im 20/43]
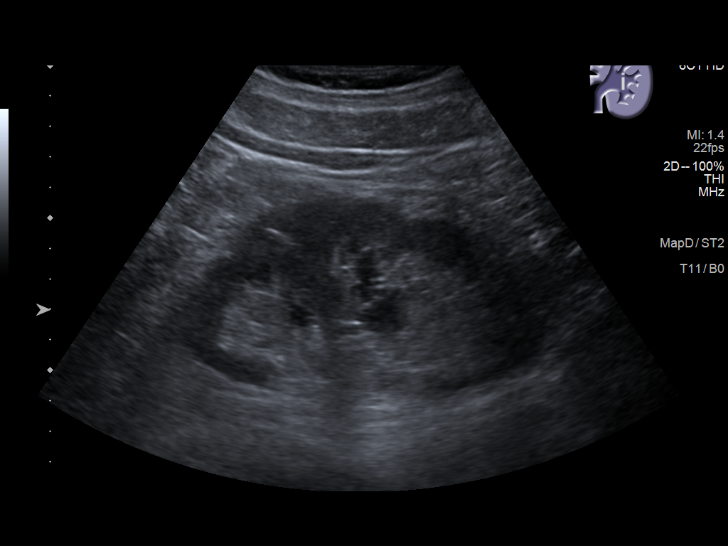
[im 23/43]
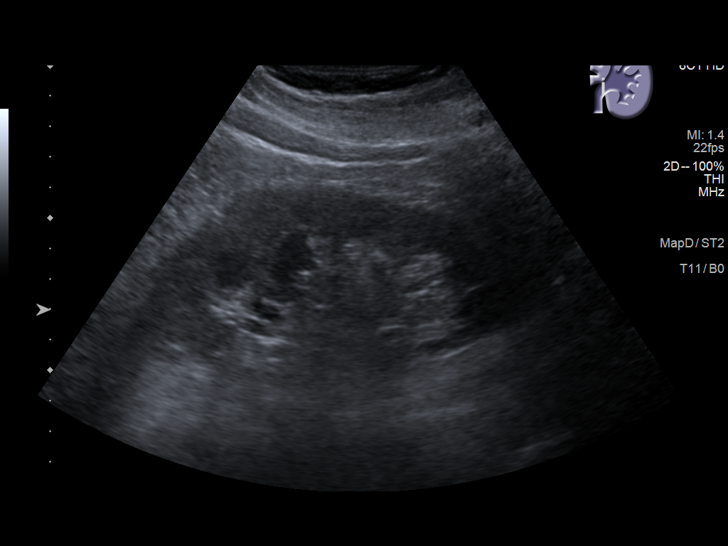
[im 27/43]
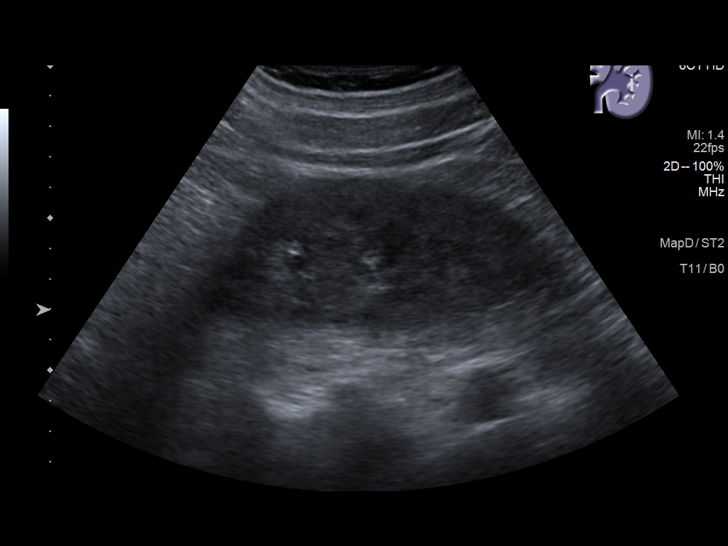
[im 29/43]
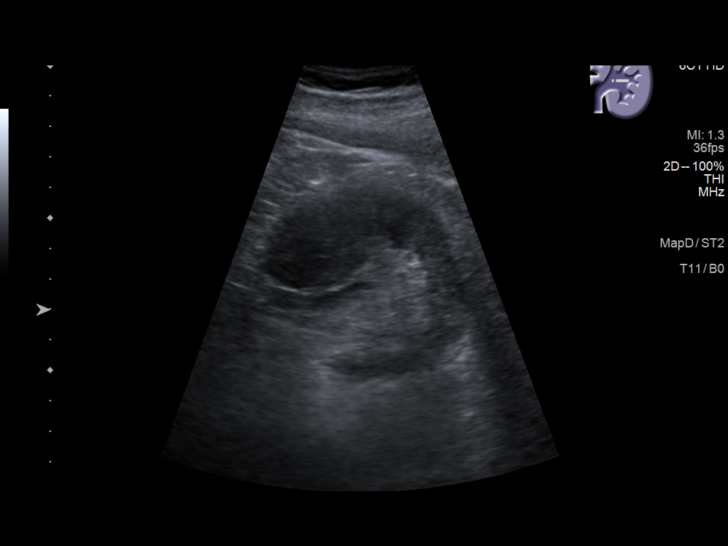
[im 32/43]
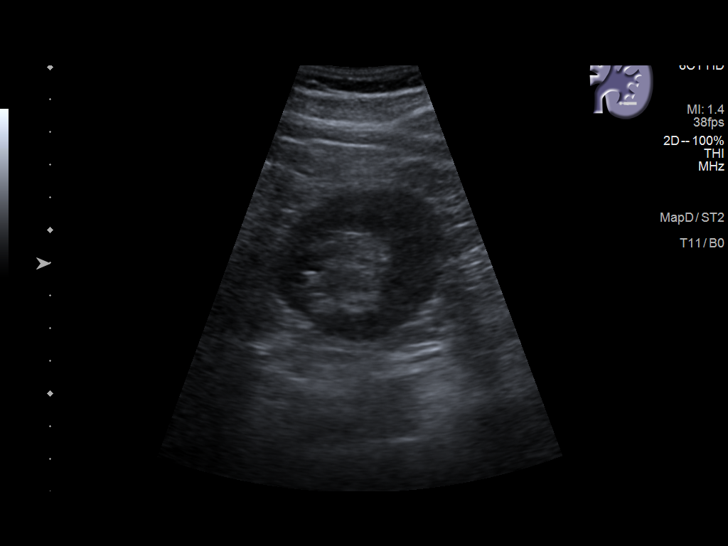
[im 36/43]
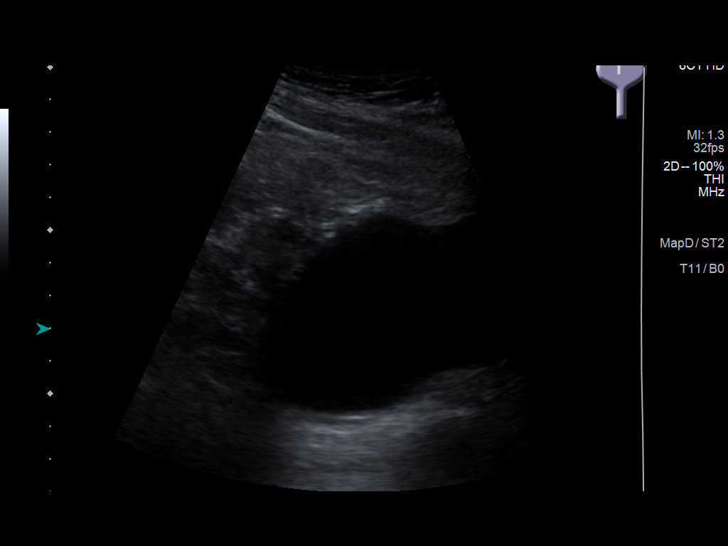
[im 39/43]
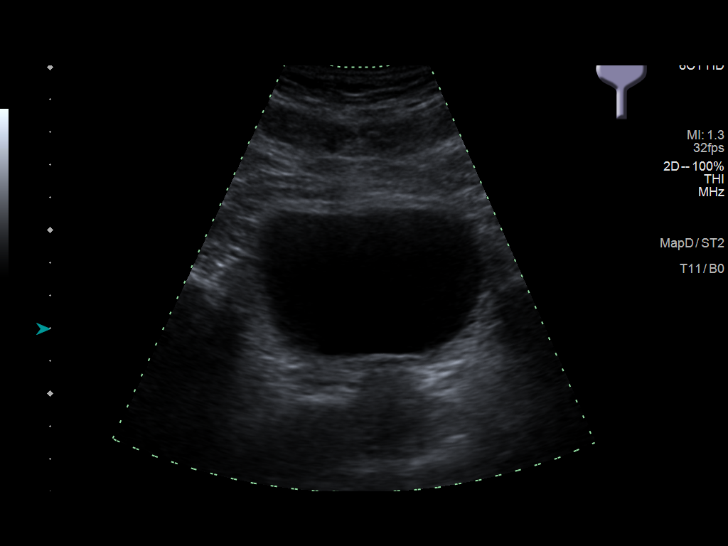
[im 43/43]
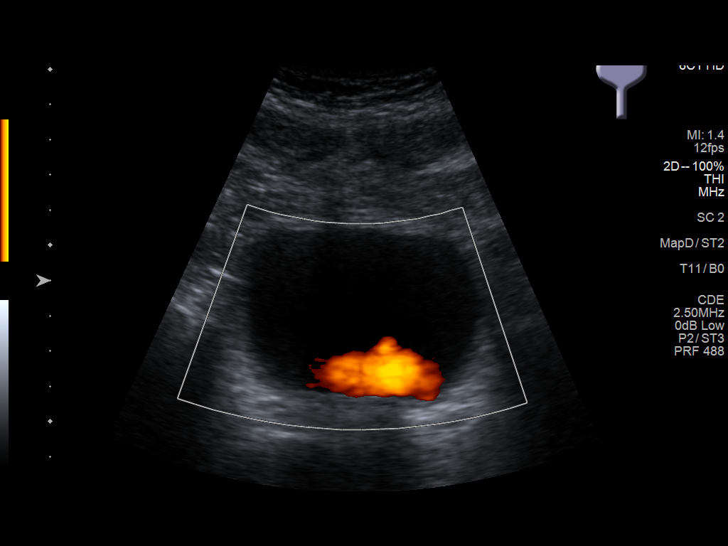

[14 of 25 positions shown; findings below may reference images not displayed]

FINDINGS: Right Kidney:

Renal measurements: 12.2 x 5.9 x 5.2 cm = volume: 195 mL .
Echogenicity within normal limits. 9 mm simple cyst is seen in lower
pole. No mass or hydronephrosis visualized.

Left Kidney:

Renal measurements: 12.4 x 6.5 x 5.4 cm = volume: 225 mL.
Echogenicity within normal limits. No mass or hydronephrosis
visualized.

Bladder:

Appears normal for degree of bladder distention. Bilateral ureteral
jets are noted.

Other:

None.
IMPRESSION: Small right renal cyst.  No other renal abnormality is noted.

## 2021-01-22 ENCOUNTER — Telehealth: Payer: Self-pay

## 2021-01-22 ENCOUNTER — Ambulatory Visit: Payer: Self-pay | Admitting: *Deleted

## 2021-01-22 DIAGNOSIS — U071 COVID-19: Secondary | ICD-10-CM | POA: Diagnosis not present

## 2021-01-22 NOTE — Telephone Encounter (Signed)
Called lft detailed vm Pt would need appt for any prescribed meds and unfortunately we have no appts available.  Told pt can do E visit, urgent care or [facility] walkin

## 2021-01-22 NOTE — Telephone Encounter (Signed)
Per agent:  "Patient called in to inform PCP that he tested positive for Covid took a test on 01/21/21 and again this morning and the symptoms he have are that he headache, cough, congestion and sinus drainage  aches and pain. Need a call from a nurse to know what to do in order to manage."   Pt reports tested positive covid yesterday. Reports cough, "Mostly dry,some clear mucous in the mornings."  Reports body aches and headache. Denies any SOB. Guidelines for self isolation reviewed per protocol,symptom management ,as well as symptoms that warrant ED visit. Pt verbalizes understanding. Pt is interested in oral anti-viral meds available if appropriate. Assured pt NT would route to practice for PCPs review. Please advise: [phone]

## 2021-01-22 NOTE — Telephone Encounter (Signed)
Copied from CRM [id number]. Topic: General - Other >> Jan 22, 2021  8:12 AM Jaquita Rector A wrote: Reason for CRM: Patient called in to inform PCP that he tested positive for Covid took a test on 01/21/21 and again this morning and the symptoms he have are  that he headache, cough, congestion and sinus drainage, and aches and pain. Need a call from a nurse to know what to do in order to manage. Can be reached at  Ph# [phone]

## 2021-03-18 ENCOUNTER — Other Ambulatory Visit: Payer: Self-pay

## 2021-03-18 DIAGNOSIS — I10 Essential (primary) hypertension: Secondary | ICD-10-CM

## 2021-03-18 MED ORDER — LISINOPRIL-HYDROCHLOROTHIAZIDE 20-12.5 MG PO TABS: 1.0000 | ORAL_TABLET | Freq: Every day | ORAL | 3 refills | Status: DC

## 2021-05-05 ENCOUNTER — Encounter: Payer: BC Managed Care – PPO | Admitting: Nurse Practitioner

## 2021-05-05 ENCOUNTER — Encounter: Payer: BC Managed Care – PPO | Admitting: Family Medicine

## 2021-05-05 DIAGNOSIS — I10 Essential (primary) hypertension: Secondary | ICD-10-CM

## 2021-05-05 DIAGNOSIS — Z114 Encounter for screening for human immunodeficiency virus [HIV]: Secondary | ICD-10-CM

## 2021-05-05 DIAGNOSIS — E785 Hyperlipidemia, unspecified: Secondary | ICD-10-CM

## 2021-05-05 DIAGNOSIS — Z1159 Encounter for screening for other viral diseases: Secondary | ICD-10-CM

## 2021-05-05 DIAGNOSIS — Z125 Encounter for screening for malignant neoplasm of prostate: Secondary | ICD-10-CM

## 2021-05-05 DIAGNOSIS — N529 Male erectile dysfunction, unspecified: Secondary | ICD-10-CM

## 2021-05-05 DIAGNOSIS — Z0000 Encounter for general adult medical examination without abnormal findings: Secondary | ICD-10-CM

## 2021-12-10 DIAGNOSIS — M545 Low back pain, unspecified: Secondary | ICD-10-CM | POA: Diagnosis not present

## 2021-12-16 DIAGNOSIS — M545 Low back pain, unspecified: Secondary | ICD-10-CM | POA: Diagnosis not present

## 2022-01-23 DIAGNOSIS — M545 Low back pain, unspecified: Secondary | ICD-10-CM | POA: Diagnosis not present

## 2022-01-28 DIAGNOSIS — M545 Low back pain, unspecified: Secondary | ICD-10-CM | POA: Diagnosis not present

## 2023-07-22 DIAGNOSIS — L738 Other specified follicular disorders: Secondary | ICD-10-CM | POA: Diagnosis not present

## 2023-07-22 DIAGNOSIS — D225 Melanocytic nevi of trunk: Secondary | ICD-10-CM | POA: Diagnosis not present

## 2023-07-22 DIAGNOSIS — L821 Other seborrheic keratosis: Secondary | ICD-10-CM | POA: Diagnosis not present

## 2023-07-22 DIAGNOSIS — D170 Benign lipomatous neoplasm of skin and subcutaneous tissue of head, face and neck: Secondary | ICD-10-CM | POA: Diagnosis not present

## 2023-07-22 DIAGNOSIS — L814 Other melanin hyperpigmentation: Secondary | ICD-10-CM | POA: Diagnosis not present

## 2023-11-01 ENCOUNTER — Encounter: Payer: Self-pay | Admitting: Nurse Practitioner

## 2023-11-01 ENCOUNTER — Ambulatory Visit: Payer: BC Managed Care – PPO | Admitting: Nurse Practitioner

## 2023-11-01 VITALS — BP 150/82 | HR 67 | Temp 98.4°F | Resp 16 | Ht 74.5 in | Wt 218.2 lb

## 2023-11-01 DIAGNOSIS — E782 Mixed hyperlipidemia: Secondary | ICD-10-CM | POA: Diagnosis not present

## 2023-11-01 DIAGNOSIS — E559 Vitamin D deficiency, unspecified: Secondary | ICD-10-CM | POA: Diagnosis not present

## 2023-11-01 DIAGNOSIS — I10 Essential (primary) hypertension: Secondary | ICD-10-CM | POA: Diagnosis not present

## 2023-11-01 DIAGNOSIS — R7301 Impaired fasting glucose: Secondary | ICD-10-CM | POA: Diagnosis not present

## 2023-11-01 DIAGNOSIS — Z125 Encounter for screening for malignant neoplasm of prostate: Secondary | ICD-10-CM

## 2023-11-01 DIAGNOSIS — Z833 Family history of diabetes mellitus: Secondary | ICD-10-CM

## 2023-11-01 NOTE — Progress Notes (Signed)
 [facility] [address] [place], [place] [address]  Internal MEDICINE  Office Visit Note  Patient Name: Douglas Heath  [id number]  [id number]  Date of Service: 11/01/2023   Complaints/HPI Pt is here for establishment of PCP. Chief Complaint  Patient presents with   New Patient (Initial Visit)    Est care    HPI [name] presents for a new patient visit to establish care.  Well-appearing 68 y.o. male with hypertension, high cholesterol, kidney stones, diverticulosis, and enlarged prostate  Work: runs a trucking company, Engineer, maintenance (IT).  Home: live at home with wife  Diet: healthy diet  Exercise: weight lifting at home, peloton or biking.  Tobacco use: none  Alcohol use: 3 glasses of tequila double shot  Illicit drug use: none  Routine CRC screening: due in 2 more years.  Eye exam and/or foot exam: Labs: due for routine labs  New or worsening pain: none    Current Medication: Outpatient Encounter Medications as of 11/01/2023  Medication Sig   lisinopril-hydrochlorothiazide (ZESTORETIC) 20-25 MG tablet Take 1 tablet by mouth daily.   rosuvastatin (CRESTOR) 10 MG tablet Take 10 mg by mouth at bedtime.   tadalafil (CIALIS) 10 MG tablet Take 10 mg by mouth daily as needed.   [DISCONTINUED] lisinopril-hydrochlorothiazide (ZESTORETIC) 20-12.5 MG tablet Take 1 tablet by mouth daily.   [DISCONTINUED] tadalafil (CIALIS) 5 MG tablet Take 1-2 tablets (5-10 mg total) by mouth daily as needed for erectile dysfunction.   Facility-Administered Encounter Medications as of 11/01/2023  Medication   sodium chloride (PF) 0.9 % injection 100 mL    Surgical History: Past Surgical History:  Procedure Laterality Date   EXTRACORPOREAL SHOCK WAVE LITHOTRIPSY Left 10/25/2019   Procedure: EXTRACORPOREAL SHOCK WAVE LITHOTRIPSY (ESWL);  Surgeon: Dustin Gimenez, MD;  Location: [facility] ORS;  Service: Urology;  Laterality: Left;    Medical History: Past Medical History:  Diagnosis Date    Hypertension     Family History: Family History  Problem Relation Age of Onset   Diabetes Mother    Obesity Mother    Heart disease Father    Leukemia Maternal Grandmother     Social History   Socioeconomic History   Marital status: Married    Spouse name: [name]   Number of children: 2   Years of education: 12   Highest education level: Bachelor's degree (e.g., BA, AB, BS)  Occupational History   Not on file  Tobacco Use   Smoking status: Never   Smokeless tobacco: Former    Types: Snuff  Vaping Use   Vaping status: Never Used  Substance and Sexual Activity   Alcohol use: Yes    Alcohol/week: 28.0 standard drinks of alcohol    Types: 28 Shots of liquor per week   Drug use: Never   Sexual activity: Yes  Other Topics Concern   Not on file  Social History Narrative   Not on file   Social Drivers of Health   Financial Resource Strain: Not on file  Food Insecurity: Not on file  Transportation Needs: Not on file  Physical Activity: Not on file  Stress: Not on file  Social Connections: Not on file  Intimate Partner Violence: Not on file     Review of Systems  Constitutional:  Negative for chills, fatigue and unexpected weight change.  HENT:  Negative for congestion, postnasal drip, rhinorrhea, sneezing and sore throat.   Eyes:  Negative for redness.  Respiratory: Negative.  Negative for cough, chest tightness and shortness  of breath.   Cardiovascular: Negative.  Negative for chest pain and palpitations.  Gastrointestinal: Negative.  Negative for abdominal pain, constipation, diarrhea, nausea and vomiting.  Genitourinary:  Negative for dysuria and frequency.  Musculoskeletal: Negative.  Negative for arthralgias, back pain, joint swelling and neck pain.  Skin:  Negative for rash.  Neurological: Negative.  Negative for tremors and numbness.  Hematological:  Negative for adenopathy. Does not bruise/bleed easily.  Psychiatric/Behavioral:  Negative for behavioral  problems (Depression), sleep disturbance and suicidal ideas. The patient is not nervous/anxious.     Vital Signs: BP (!) 150/82   Pulse 67   Temp 98.4 F (36.9 C)   Resp 16   Ht 6' 2.5" (1.892 m)   Wt 218 lb 3.2 oz (99 kg)   SpO2 97%   BMI 27.64 kg/m    Physical Exam Vitals reviewed.  Constitutional:      General: He is not in acute distress.    Appearance: Normal appearance. He is not ill-appearing.  HENT:     Head: Normocephalic and atraumatic.  Eyes:     Pupils: Pupils are equal, round, and reactive to light.  Cardiovascular:     Rate and Rhythm: Normal rate and regular rhythm.  Pulmonary:     Effort: Pulmonary effort is normal. No respiratory distress.  Neurological:     Mental Status: He is alert and oriented to person, place, and time.  Psychiatric:        Mood and Affect: Mood normal.        Behavior: Behavior normal.       Assessment/Plan: 1. Primary hypertension (Primary) Elevated today, recheck at next visit, Routine labs ordered  - CBC with Differential/Platelet - CMP14+EGFR - Lipid Profile  2. Impaired fasting glucose Routine labs ordered  - CBC with Differential/Platelet - CMP14+EGFR - Lipid Profile - Hgb A1C w/o eAG  3. Mixed hyperlipidemia Routine labs ordered  - CBC with Differential/Platelet - CMP14+EGFR - Lipid Profile  4. Vitamin D deficiency Routine lab ordered  - Vitamin D (25 hydroxy)  5. Screening for prostate cancer PSA lab ordered - PSA Total (Reflex To Free)  6. Family history of diabetes mellitus in mother Routine labs ordered - CBC with Differential/Platelet - CMP14+EGFR - Lipid Profile - Hgb A1C w/o eAG    General Counseling: Douglas Heath verbalizes understanding of the findings of todays visit and agrees with plan of treatment. I have discussed any further diagnostic evaluation that may be needed or ordered today. We also reviewed his medications today. he has been encouraged to call the office with any questions or  concerns that should arise related to todays visit.    Orders Placed This Encounter  Procedures   CBC with Differential/Platelet   CMP14+EGFR   Lipid Profile   PSA Total (Reflex To Free)   Hgb A1C w/o eAG   Vitamin D (25 hydroxy)    No orders of the defined types were placed in this encounter.   Return for AWV, Alira Fretwell PCP at earliest available opening, have labs done prior to visit. .  Time spent:30 Minutes Time spent with patient included reviewing progress notes, labs, imaging studies, and discussing plan for follow up.   [place] Controlled Substance Database was reviewed by me for overdose risk score (ORS)   This patient was seen by Laurence Pons, FNP-C in collaboration with Dr. Verneta Gone as a part of collaborative care agreement.   Jennamarie Goings R. Bobbi Burow, MSN, FNP-C Internal Medicine

## 2023-11-02 ENCOUNTER — Telehealth: Payer: Self-pay | Admitting: Nurse Practitioner

## 2023-11-02 NOTE — Telephone Encounter (Signed)
 MR request faxed to [facility]; [phone]

## 2023-11-08 DIAGNOSIS — E782 Mixed hyperlipidemia: Secondary | ICD-10-CM | POA: Diagnosis not present

## 2023-11-08 DIAGNOSIS — E559 Vitamin D deficiency, unspecified: Secondary | ICD-10-CM | POA: Diagnosis not present

## 2023-11-08 DIAGNOSIS — R7301 Impaired fasting glucose: Secondary | ICD-10-CM | POA: Diagnosis not present

## 2023-11-08 DIAGNOSIS — Z833 Family history of diabetes mellitus: Secondary | ICD-10-CM | POA: Diagnosis not present

## 2023-11-08 DIAGNOSIS — Z125 Encounter for screening for malignant neoplasm of prostate: Secondary | ICD-10-CM | POA: Diagnosis not present

## 2023-11-08 DIAGNOSIS — I10 Essential (primary) hypertension: Secondary | ICD-10-CM | POA: Diagnosis not present

## 2023-11-09 LAB — CBC WITH DIFFERENTIAL/PLATELET
Basophils Absolute: 0 10*3/uL (ref 0.0–0.2)
Basos: 1 %
EOS (ABSOLUTE): 0.1 10*3/uL (ref 0.0–0.4)
Eos: 1 %
Hematocrit: 44.5 % (ref 37.5–51.0)
Hemoglobin: 15.1 g/dL (ref 13.0–17.7)
Immature Grans (Abs): 0 10*3/uL (ref 0.0–0.1)
Immature Granulocytes: 0 %
Lymphocytes Absolute: 1.4 10*3/uL (ref 0.7–3.1)
Lymphs: 35 %
MCH: 31.9 pg (ref 26.6–33.0)
MCHC: 33.9 g/dL (ref 31.5–35.7)
MCV: 94 fL (ref 79–97)
Monocytes Absolute: 0.4 10*3/uL (ref 0.1–0.9)
Monocytes: 10 %
Neutrophils Absolute: 2.2 10*3/uL (ref 1.4–7.0)
Neutrophils: 53 %
Platelets: 146 10*3/uL — ABNORMAL LOW (ref 150–450)
RBC: 4.74 x10E6/uL (ref 4.14–5.80)
RDW: 13.3 % (ref 11.6–15.4)
WBC: 4.1 10*3/uL (ref 3.4–10.8)

## 2023-11-09 LAB — HGB A1C W/O EAG: Hgb A1c MFr Bld: 5.7 % — ABNORMAL HIGH (ref 4.8–5.6)

## 2023-11-09 LAB — CMP14+EGFR
ALT: 33 IU/L (ref 0–44)
AST: 22 IU/L (ref 0–40)
Albumin: 4.6 g/dL (ref 3.9–4.9)
Alkaline Phosphatase: 61 IU/L (ref 44–121)
BUN/Creatinine Ratio: 15 (ref 10–24)
BUN: 17 mg/dL (ref 8–27)
Bilirubin Total: 0.6 mg/dL (ref 0.0–1.2)
CO2: 25 mmol/L (ref 20–29)
Calcium: 9.5 mg/dL (ref 8.6–10.2)
Chloride: 103 mmol/L (ref 96–106)
Creatinine, Ser: 1.1 mg/dL (ref 0.76–1.27)
Globulin, Total: 1.9 g/dL (ref 1.5–4.5)
Glucose: 109 mg/dL — ABNORMAL HIGH (ref 70–99)
Potassium: 4.1 mmol/L (ref 3.5–5.2)
Sodium: 142 mmol/L (ref 134–144)
Total Protein: 6.5 g/dL (ref 6.0–8.5)
eGFR: 74 mL/min/{1.73_m2} (ref >59)

## 2023-11-09 LAB — LIPID PANEL
Chol/HDL Ratio: 2.7 ratio (ref 0.0–5.0)
Cholesterol, Total: 159 mg/dL (ref 100–199)
HDL: 59 mg/dL (ref >39)
LDL Chol Calc (NIH): 84 mg/dL (ref 0–99)
Triglycerides: 83 mg/dL (ref 0–149)
VLDL Cholesterol Cal: 16 mg/dL (ref 5–40)

## 2023-11-09 LAB — PSA TOTAL (REFLEX TO FREE): Prostate Specific Ag, Serum: 3 ng/mL (ref 0.0–4.0)

## 2023-11-09 LAB — VITAMIN D 25 HYDROXY (VIT D DEFICIENCY, FRACTURES): Vit D, 25-Hydroxy: 64.3 ng/mL (ref 30.0–100.0)

## 2023-11-16 DIAGNOSIS — H5203 Hypermetropia, bilateral: Secondary | ICD-10-CM | POA: Diagnosis not present

## 2023-11-24 ENCOUNTER — Ambulatory Visit: Payer: BC Managed Care – PPO | Admitting: Family Medicine

## 2023-11-24 ENCOUNTER — Encounter: Payer: Self-pay | Admitting: Nurse Practitioner

## 2023-11-24 ENCOUNTER — Ambulatory Visit: Admitting: Nurse Practitioner

## 2023-11-24 VITALS — BP 120/74 | HR 74 | Temp 98.4°F | Resp 16 | Ht 74.5 in | Wt 220.8 lb

## 2023-11-24 DIAGNOSIS — I10 Essential (primary) hypertension: Secondary | ICD-10-CM

## 2023-11-24 DIAGNOSIS — R7303 Prediabetes: Secondary | ICD-10-CM

## 2023-11-24 DIAGNOSIS — E782 Mixed hyperlipidemia: Secondary | ICD-10-CM

## 2023-11-24 DIAGNOSIS — Z0000 Encounter for general adult medical examination without abnormal findings: Secondary | ICD-10-CM | POA: Diagnosis not present

## 2023-11-24 NOTE — Progress Notes (Signed)
 [facility] [address] [place], [place] [address]  Internal MEDICINE  Office Visit Note  Patient Name: Douglas Heath  [id number]  [id number]  Date of Service: 11/24/2023  Chief Complaint  Patient presents with   Hypertension   Medicare Wellness    HPI Douglas Heath presents for a medicare annual wellness visit.  Well-appearing 68 y.o. male with hypertension, high cholesterol, kidney stones, diverticulosis and enlarged prostate.   Routine CRC screening: due in 2 years  Labs: lab results reviewed today Slightly low platelet level Cholesterol panel is normal Vitamin D  is normal  Liver function returned to normal A1c is slightly elevated at 5.7  Normal PSA level New or worsening pain: none  Other concerns: none      11/24/2023    1:54 PM  MMSE - Mini Mental State Exam  Orientation to time 5  Orientation to Place 5  Registration 3  Attention/ Calculation 5  Recall 3  Language- name 2 objects 2  Language- repeat 1  Language- follow 3 step command 3  Language- read & follow direction 1  Write a sentence 1  Copy design 1  Total score 30    Functional Status Survey: Is the patient deaf or have difficulty hearing?: No Does the patient have difficulty seeing, even when wearing glasses/contacts?: No Does the patient have difficulty concentrating, remembering, or making decisions?: No Does the patient have difficulty walking or climbing stairs?: No Does the patient have difficulty dressing or bathing?: No Does the patient have difficulty doing errands alone such as visiting a doctor's office or shopping?: No     04/28/2020   11:16 AM 04/28/2020   10:10 PM 12/03/2020    2:44 PM 11/01/2023    3:17 PM 11/24/2023    1:54 PM  Fall Risk  Falls in the past year?   0 0 0  Was there an injury with Fall?   0 0 0  Fall Risk Category Calculator   0 0 0  Fall Risk Category (Retired)   Low    (RETIRED) Patient Fall Risk Level Low fall risk Low fall risk     Patient at Risk for  Falls Due to    No Fall Risks No Fall Risks  Fall risk Follow up    Falls evaluation completed Falls evaluation completed       11/24/2023    1:54 PM  Depression screen PHQ 2/9  Decreased Interest 0  Down, Depressed, Hopeless 0  PHQ - 2 Score 0        Current Medication: Outpatient Encounter Medications as of 11/24/2023  Medication Sig   lisinopril -hydrochlorothiazide  (ZESTORETIC ) 20-25 MG tablet Take 1 tablet by mouth daily.   rosuvastatin (CRESTOR) 10 MG tablet Take 10 mg by mouth at bedtime.   tadalafil  (CIALIS ) 10 MG tablet Take 10 mg by mouth daily as needed.   Facility-Administered Encounter Medications as of 11/24/2023  Medication   sodium chloride  (PF) 0.9 % injection 100 mL    Surgical History: Past Surgical History:  Procedure Laterality Date   EXTRACORPOREAL SHOCK WAVE LITHOTRIPSY Left 10/25/2019   Procedure: EXTRACORPOREAL SHOCK WAVE LITHOTRIPSY (ESWL);  Surgeon: Dustin Gimenez, MD;  Location: [facility] ORS;  Service: Urology;  Laterality: Left;    Medical History: Past Medical History:  Diagnosis Date   Hypertension     Family History: Family History  Problem Relation Age of Onset   Diabetes Mother    Obesity Mother    Heart disease Father    Leukemia Maternal  Grandmother     Social History   Socioeconomic History   Marital status: Married    Spouse name: [name]   Number of children: 2   Years of education: 12   Highest education level: Bachelor's degree (e.g., BA, AB, BS)  Occupational History   Not on file  Tobacco Use   Smoking status: Never   Smokeless tobacco: Former    Types: Snuff  Vaping Use   Vaping status: Never Used  Substance and Sexual Activity   Alcohol use: Yes    Alcohol/week: 28.0 standard drinks of alcohol    Types: 28 Shots of liquor per week   Drug use: Never   Sexual activity: Yes  Other Topics Concern   Not on file  Social History Narrative   Not on file   Social Drivers of Health   Financial Resource Strain:  Not on file  Food Insecurity: Not on file  Transportation Needs: Not on file  Physical Activity: Not on file  Stress: Not on file  Social Connections: Not on file  Intimate Partner Violence: Not on file      Review of Systems  Constitutional:  Positive for fatigue. Negative for activity change, appetite change, chills, fever and unexpected weight change.  HENT:  Negative for congestion, ear pain, postnasal drip, rhinorrhea, sore throat and trouble swallowing.   Eyes: Negative.   Respiratory: Negative.  Negative for cough, chest tightness, shortness of breath and wheezing.   Cardiovascular: Negative.  Negative for chest pain and palpitations.  Gastrointestinal: Negative.  Negative for abdominal pain, blood in stool, constipation, diarrhea, nausea and vomiting.  Endocrine: Negative.   Genitourinary: Negative.  Negative for difficulty urinating, dysuria, frequency, hematuria and urgency.  Musculoskeletal: Negative.  Negative for arthralgias, back pain, joint swelling, myalgias and neck pain.  Skin: Negative.  Negative for rash and wound.  Allergic/Immunologic: Negative.  Negative for immunocompromised state.  Neurological: Negative.  Negative for dizziness, seizures, numbness and headaches.  Hematological: Negative.   Psychiatric/Behavioral: Negative.  Negative for behavioral problems, self-injury, sleep disturbance and suicidal ideas. The patient is not nervous/anxious.     Vital Signs: BP 120/74   Pulse 74   Temp 98.4 F (36.9 C)   Resp 16   Ht 6' 2.5" (1.892 m)   Wt 220 lb 12.8 oz (100.2 kg)   SpO2 97%   BMI 27.97 kg/m    Physical Exam Vitals reviewed.  Constitutional:      General: He is not in acute distress.    Appearance: Normal appearance. He is not ill-appearing.  HENT:     Head: Normocephalic and atraumatic.  Eyes:     Pupils: Pupils are equal, round, and reactive to light.  Cardiovascular:     Rate and Rhythm: Normal rate and regular rhythm.     Heart  sounds: Normal heart sounds. No murmur heard. Pulmonary:     Effort: Pulmonary effort is normal. No respiratory distress.     Breath sounds: Normal breath sounds. No wheezing.  Skin:    General: Skin is warm and dry.     Capillary Refill: Capillary refill takes less than 2 seconds.  Neurological:     Mental Status: He is alert and oriented to person, place, and time.     Motor: No weakness.     Gait: Gait normal.  Psychiatric:        Mood and Affect: Mood normal.        Behavior: Behavior normal.  Thought Content: Thought content normal.        Judgment: Judgment normal.        Assessment/Plan: 1. Encounter for subsequent annual wellness visit (AWV) in Medicare patient (Primary) Age-appropriate preventive screenings and vaccinations discussed. Routine labs for health maintenance results discussed with patient today. PHM updated.    2. Prediabetes Follow diet modifications as discussed. Continue regular physical activity as tolerated. Repeat A1c in 6 months   3. Primary hypertension Stable, continue lisinopril -hydrochlorothiazide  as prescribed.   4. Mixed hyperlipidemia On statin therapy, continue as prescribed.       General Counseling: Douglas Heath verbalizes understanding of the findings of todays visit and agrees with plan of treatment. I have discussed any further diagnostic evaluation that may be needed or ordered today. We also reviewed his medications today. he has been encouraged to call the office with any questions or concerns that should arise related to todays visit.    No orders of the defined types were placed in this encounter.   No orders of the defined types were placed in this encounter.   Return in about 6 months (around 05/26/2024) for F/U, Recheck A1C, Douglas Heath PCP.   Total time spent:30 Minutes Time spent includes review of chart, medications, test results, and follow up plan with the patient.   [place] Controlled Substance Database was reviewed by  me.  This patient was seen by Laurence Pons, FNP-C in collaboration with Dr. Verneta Gone as a part of collaborative care agreement.  Douglas Eliot R. Bobbi Burow, MSN, FNP-C Internal medicine

## 2023-12-10 ENCOUNTER — Encounter: Payer: Self-pay | Admitting: Nurse Practitioner

## 2024-05-09 ENCOUNTER — Encounter: Payer: Self-pay | Admitting: Nurse Practitioner

## 2024-05-09 ENCOUNTER — Ambulatory Visit (INDEPENDENT_AMBULATORY_CARE_PROVIDER_SITE_OTHER): Admitting: Nurse Practitioner

## 2024-05-09 VITALS — BP 110/70 | HR 62 | Temp 98.0°F | Resp 16 | Ht 74.5 in | Wt 214.6 lb

## 2024-05-09 DIAGNOSIS — R42 Dizziness and giddiness: Secondary | ICD-10-CM

## 2024-05-09 NOTE — Progress Notes (Signed)
 [facility] [address] [place], [place] [address]  Internal MEDICINE  Office Visit Note  Patient Name: Douglas Heath  [id number]  [id number]  Date of Service: 05/09/2024  Chief Complaint  Patient presents with   Acute Visit    Dizziness 4-6 weeks.      HPI Douglas Heath presents for an acute sick visit for dizziness and vertigo.  --onset of episodes of dizziness started about 4-6 weeks ago Worst episode occurred last week when patient was dizzy and room started spinning and it lasted about 3-4 minutes. When he closed his eyes, still felt like room was spinning.      Current Medication:  Outpatient Encounter Medications as of 05/09/2024  Medication Sig   lisinopril -hydrochlorothiazide  (ZESTORETIC ) 20-25 MG tablet Take 1 tablet by mouth daily.   rosuvastatin (CRESTOR) 10 MG tablet Take 10 mg by mouth at bedtime.   tadalafil  (CIALIS ) 10 MG tablet Take 10 mg by mouth daily as needed.   Facility-Administered Encounter Medications as of 05/09/2024  Medication   sodium chloride  (PF) 0.9 % injection 100 mL      Medical History: Past Medical History:  Diagnosis Date   Hypertension      Vital Signs: BP 110/70   Pulse 62   Temp 98 F (36.7 C)   Resp 16   Ht 6' 2.5 (1.892 m)   Wt 214 lb 9.6 oz (97.3 kg)   SpO2 97%   BMI 27.18 kg/m    Review of Systems  Constitutional: Negative.  Negative for appetite change and fatigue.  HENT: Negative.  Negative for ear discharge, ear pain and hearing loss.   Respiratory:  Negative for cough, chest tightness, shortness of breath and wheezing.   Neurological:  Positive for dizziness. Negative for weakness, light-headedness and headaches.    Physical Exam Vitals reviewed.  Constitutional:      General: He is not in acute distress.    Appearance: Normal appearance. He is not ill-appearing.  HENT:     Head: Normocephalic and atraumatic.  Eyes:     Pupils: Pupils are equal, round, and reactive to light.  Cardiovascular:      Rate and Rhythm: Normal rate and regular rhythm.  Pulmonary:     Effort: Pulmonary effort is normal. No respiratory distress.  Neurological:     Mental Status: He is alert and oriented to person, place, and time.  Psychiatric:        Mood and Affect: Mood normal.        Behavior: Behavior normal.       Assessment/Plan: 1. Vertigo (Primary) Declined ENT referral for now and declined prn meclizine prescription for now but will call if symptoms do not improve or worsen.   2. Dizziness Declined referral and medication for now.    General Counseling: Douglas Heath verbalizes understanding of the findings of todays visit and agrees with plan of treatment. I have discussed any further diagnostic evaluation that may be needed or ordered today. We also reviewed his medications today. he has been encouraged to call the office with any questions or concerns that should arise related to todays visit.    Counseling:    No orders of the defined types were placed in this encounter.   No orders of the defined types were placed in this encounter.   Return if symptoms worsen or fail to improve, for patient will call if he decides to see ENT or needs medication. .  [place] Controlled Substance Database was reviewed by me for  overdose risk score (ORS)  Time spent:30 Minutes Time spent with patient included reviewing progress notes, labs, imaging studies, and discussing plan for follow up.   This patient was seen by Mardy Maxin, FNP-C in collaboration with Dr. Sigrid Bathe as a part of collaborative care agreement.  Senetra Dillin R. Maxin, MSN, FNP-C Internal Medicine

## 2024-05-24 ENCOUNTER — Ambulatory Visit (INDEPENDENT_AMBULATORY_CARE_PROVIDER_SITE_OTHER): Admitting: Nurse Practitioner

## 2024-05-24 ENCOUNTER — Telehealth: Payer: Self-pay | Admitting: Nurse Practitioner

## 2024-05-24 ENCOUNTER — Encounter: Payer: Self-pay | Admitting: Nurse Practitioner

## 2024-05-24 VITALS — BP 120/74 | HR 76 | Temp 97.6°F | Resp 16 | Ht 74.5 in | Wt 212.6 lb

## 2024-05-24 DIAGNOSIS — N529 Male erectile dysfunction, unspecified: Secondary | ICD-10-CM | POA: Diagnosis not present

## 2024-05-24 DIAGNOSIS — R42 Dizziness and giddiness: Secondary | ICD-10-CM | POA: Diagnosis not present

## 2024-05-24 DIAGNOSIS — I10 Essential (primary) hypertension: Secondary | ICD-10-CM | POA: Diagnosis not present

## 2024-05-24 DIAGNOSIS — E782 Mixed hyperlipidemia: Secondary | ICD-10-CM

## 2024-05-24 DIAGNOSIS — Z23 Encounter for immunization: Secondary | ICD-10-CM

## 2024-05-24 DIAGNOSIS — R7303 Prediabetes: Secondary | ICD-10-CM | POA: Diagnosis not present

## 2024-05-24 LAB — POCT GLYCOSYLATED HEMOGLOBIN (HGB A1C): Hemoglobin A1C: 5.3 % (ref 4.0–5.6)

## 2024-05-24 MED ORDER — TADALAFIL 10 MG PO TABS: 10.0000 mg | ORAL_TABLET | Freq: Every day | ORAL | 5 refills | Status: AC | PRN

## 2024-05-24 MED ORDER — LISINOPRIL-HYDROCHLOROTHIAZIDE 20-25 MG PO TABS: 1.0000 | ORAL_TABLET | Freq: Every day | ORAL | 3 refills | Status: AC

## 2024-05-24 MED ORDER — ROSUVASTATIN CALCIUM 10 MG PO TABS
10.0000 mg | ORAL_TABLET | Freq: Every day | ORAL | 3 refills | Status: AC
Start: 2024-05-24 — End: ?

## 2024-05-24 MED ORDER — PNEUMOCOCCAL 20-VAL CONJ VACC 0.5 ML IM SUSY: 0.5000 mL | PREFILLED_SYRINGE | Freq: Once | INTRAMUSCULAR | 0 refills | Status: AC | PRN

## 2024-05-24 MED ORDER — ZOSTER VAC RECOMB ADJUVANTED 50 MCG/0.5ML IM SUSR: 0.5000 mL | Freq: Once | INTRAMUSCULAR | 1 refills | Status: AC | PRN

## 2024-05-24 NOTE — Telephone Encounter (Signed)
 Otolaryngology referral sent via [facility] to [facility]. Notified patient. Gave telephone # [phone]

## 2024-05-24 NOTE — Progress Notes (Signed)
 [facility] [address] [place], [place] [address]  Internal MEDICINE  Office Visit Note  Patient Name: Douglas Heath  [id number]  [id number]  Date of Service: 05/24/2024  Chief Complaint  Patient presents with   Hypertension   Follow-up    HPI Douglas Heath presents for a follow-up visit for prediabetes, hypertension, high cholesterol, refills, screenings and vertigo/dizziness. Prediabetes -- A1c has improved to normal range at 5.3 CRC -- received cologuard kit from his insurance company in the mail.  Vertigo and dizziness -- had another episode of vertigo, requesting ENT referral now as previously discussed.  Due for shingles and pneumonia vaccines -- unsure if Douglas Heath will get them but ok with prescriptions being sent  Hypertension -- controlled with lisinopril -hydrochlorothiazide  High cholesterol -- taking rosuvastatin  daily ED -- takes tadalafil  as needed.     Current Medication: Outpatient Encounter Medications as of 05/24/2024  Medication Sig   pneumococcal 20-valent conjugate vaccine (PREVNAR 20) 0.5 ML injection Inject 0.5 mLs into the muscle once as needed for up to 1 dose for immunization.   Zoster Vaccine Adjuvanted [facility]) injection Inject 0.5 mLs into the muscle once as needed for up to 1 dose (vaccination).   lisinopril -hydrochlorothiazide  (ZESTORETIC ) 20-25 MG tablet Take 1 tablet by mouth daily.   rosuvastatin  (CRESTOR ) 10 MG tablet Take 1 tablet (10 mg total) by mouth at bedtime.   tadalafil  (CIALIS ) 10 MG tablet Take 1 tablet (10 mg total) by mouth daily as needed.   [DISCONTINUED] lisinopril -hydrochlorothiazide  (ZESTORETIC ) 20-25 MG tablet Take 1 tablet by mouth daily.   [DISCONTINUED] rosuvastatin  (CRESTOR ) 10 MG tablet Take 10 mg by mouth at bedtime.   [DISCONTINUED] tadalafil  (CIALIS ) 10 MG tablet Take 10 mg by mouth daily as needed.   Facility-Administered Encounter Medications as of 05/24/2024  Medication   sodium chloride  (PF) 0.9 % injection 100 mL     Surgical History: Past Surgical History:  Procedure Laterality Date   EXTRACORPOREAL SHOCK WAVE LITHOTRIPSY Left 10/25/2019   Procedure: EXTRACORPOREAL SHOCK WAVE LITHOTRIPSY (ESWL);  Surgeon: Penne Knee, MD;  Location: [facility] ORS;  Service: Urology;  Laterality: Left;    Medical History: Past Medical History:  Diagnosis Date   Hypertension     Family History: Family History  Problem Relation Age of Onset   Diabetes Mother    Obesity Mother    Heart disease Father    Leukemia Maternal Grandmother     Social History   Socioeconomic History   Marital status: Married    Spouse name: Ely   Number of children: 2   Years of education: 12   Highest education level: Bachelor's degree (e.g., BA, AB, BS)  Occupational History   Not on file  Tobacco Use   Smoking status: Never   Smokeless tobacco: Former    Types: Snuff  Vaping Use   Vaping status: Never Used  Substance and Sexual Activity   Alcohol use: Yes    Alcohol/week: 28.0 standard drinks of alcohol    Types: 28 Shots of liquor per week   Drug use: Never   Sexual activity: Yes  Other Topics Concern   Not on file  Social History Narrative   Not on file   Social Drivers of Health   Financial Resource Strain: Not on file  Food Insecurity: Not on file  Transportation Needs: Not on file  Physical Activity: Not on file  Stress: Not on file  Social Connections: Not on file  Intimate Partner Violence: Not on file  Review of Systems  Constitutional: Negative.  Negative for appetite change and fatigue.  HENT: Negative.  Negative for ear discharge, ear pain and hearing loss.   Respiratory:  Negative for cough, chest tightness, shortness of breath and wheezing.   Neurological:  Positive for dizziness. Negative for weakness, light-headedness and headaches.    Vital Signs: BP 120/74   Pulse 76   Temp 97.6 F (36.4 C)   Resp 16   Ht 6' 2.5 (1.892 m)   Wt 212 lb 9.6 oz (96.4 kg)   SpO2 97%    BMI 26.93 kg/m    Physical Exam Vitals reviewed.  Constitutional:      General: Douglas Heath is not in acute distress.    Appearance: Normal appearance. Douglas Heath is not ill-appearing.  HENT:     Head: Normocephalic and atraumatic.  Eyes:     Pupils: Pupils are equal, round, and reactive to light.  Cardiovascular:     Rate and Rhythm: Normal rate and regular rhythm.  Pulmonary:     Effort: Pulmonary effort is normal. No respiratory distress.  Neurological:     Mental Status: Douglas Heath is alert and oriented to person, place, and time.  Psychiatric:        Mood and Affect: Mood normal.        Behavior: Behavior normal.        Assessment/Plan: 1. Primary hypertension (Primary) Stable, continue lisinopril -hydrochlorothiazide  as prescribed.  - lisinopril -hydrochlorothiazide  (ZESTORETIC ) 20-25 MG tablet; Take 1 tablet by mouth daily.  Dispense: 90 tablet; Refill: 3  2. Prediabetes Stable, A1c is in normal range now  - POCT glycosylated hemoglobin (Hb A1C)  3. Mixed hyperlipidemia Continue rosuvastatin  as prescribed.  - rosuvastatin  (CRESTOR ) 10 MG tablet; Take 1 tablet (10 mg total) by mouth at bedtime.  Dispense: 90 tablet; Refill: 3  4. Erectile dysfunction, unspecified erectile dysfunction type Continue tadalafil  as needed  - tadalafil  (CIALIS ) 10 MG tablet; Take 1 tablet (10 mg total) by mouth daily as needed.  Dispense: 30 tablet; Refill: 5  5. Vertigo Referred to ENT - Ambulatory referral to ENT  6. Dizziness Referred to ENT - Ambulatory referral to ENT  7. Need for vaccination - Zoster Vaccine Adjuvanted [facility]) injection; Inject 0.5 mLs into the muscle once as needed for up to 1 dose (vaccination).  Dispense: 0.5 mL; Refill: 1 - pneumococcal 20-valent conjugate vaccine (PREVNAR 20) 0.5 ML injection; Inject 0.5 mLs into the muscle once as needed for up to 1 dose for immunization.  Dispense: 0.5 mL; Refill: 0   General Counseling: Douglas Heath verbalizes understanding of the findings of  todays visit and agrees with plan of treatment. I have discussed any further diagnostic evaluation that may be needed or ordered today. We also reviewed his medications today. Douglas Heath has been encouraged to call the office with any questions or concerns that should arise related to todays visit.    Orders Placed This Encounter  Procedures   Ambulatory referral to ENT   POCT glycosylated hemoglobin (Hb A1C)    Meds ordered this encounter  Medications   Zoster Vaccine Adjuvanted [facility]) injection    Sig: Inject 0.5 mLs into the muscle once as needed for up to 1 dose (vaccination).    Dispense:  0.5 mL    Refill:  1    Needs 2 dose series   pneumococcal 20-valent conjugate vaccine (PREVNAR 20) 0.5 ML injection    Sig: Inject 0.5 mLs into the muscle once as needed for up to 1 dose for  immunization.    Dispense:  0.5 mL    Refill:  0    Due for prevnar 20   lisinopril -hydrochlorothiazide  (ZESTORETIC ) 20-25 MG tablet    Sig: Take 1 tablet by mouth daily.    Dispense:  90 tablet    Refill:  3    For future refills keep on file   rosuvastatin  (CRESTOR ) 10 MG tablet    Sig: Take 1 tablet (10 mg total) by mouth at bedtime.    Dispense:  90 tablet    Refill:  3    For future refills, keep on file   tadalafil  (CIALIS ) 10 MG tablet    Sig: Take 1 tablet (10 mg total) by mouth daily as needed.    Dispense:  30 tablet    Refill:  5    For future refills, keep on file.    Return for previously scheduled, AWV, Bertrand Vowels PCP in march next year.   Total time spent:30 Minutes Time spent includes review of chart, medications, test results, and follow up plan with the patient.   [place] Controlled Substance Database was reviewed by me.  This patient was seen by Mardy Maxin, FNP-C in collaboration with Dr. Sigrid Bathe as a part of collaborative care agreement.   Shloime Keilman R. Maxin, MSN, FNP-C Internal medicine

## 2024-05-28 DIAGNOSIS — Z1211 Encounter for screening for malignant neoplasm of colon: Secondary | ICD-10-CM | POA: Diagnosis not present

## 2024-05-28 DIAGNOSIS — Z1212 Encounter for screening for malignant neoplasm of rectum: Secondary | ICD-10-CM | POA: Diagnosis not present

## 2024-06-01 LAB — COLOGUARD: COLOGUARD: NEGATIVE

## 2024-06-13 ENCOUNTER — Telehealth: Payer: Self-pay | Admitting: Nurse Practitioner

## 2024-06-13 NOTE — Telephone Encounter (Signed)
 Otolaryngology appointment 06/18/2024 with [facility]

## 2024-06-27 DIAGNOSIS — H903 Sensorineural hearing loss, bilateral: Secondary | ICD-10-CM | POA: Diagnosis not present

## 2024-06-27 DIAGNOSIS — R42 Dizziness and giddiness: Secondary | ICD-10-CM | POA: Diagnosis not present

## 2024-08-02 ENCOUNTER — Encounter: Payer: Self-pay | Admitting: Nurse Practitioner

## 2024-08-02 ENCOUNTER — Ambulatory Visit: Admitting: Nurse Practitioner

## 2024-08-02 VITALS — BP 134/82 | HR 60 | Temp 96.9°F | Resp 16 | Ht 74.5 in | Wt 216.0 lb

## 2024-08-02 DIAGNOSIS — I10 Essential (primary) hypertension: Secondary | ICD-10-CM | POA: Diagnosis not present

## 2024-08-02 DIAGNOSIS — E782 Mixed hyperlipidemia: Secondary | ICD-10-CM

## 2024-08-02 DIAGNOSIS — R7303 Prediabetes: Secondary | ICD-10-CM

## 2024-08-02 NOTE — Progress Notes (Signed)
 [facility] [address] [place], [place] [address]  Internal MEDICINE  Office Visit Note  Patient Name: Douglas Heath  [id number]  [id number]  Date of Service: 08/02/2024  Chief Complaint  Patient presents with   Acute Visit    B/p running high     HPI Douglas Heath presents for an acute sick visit for transient elevated BP --had a couple days of elevated BP 160s/90s. Denies any headache but has been having some sinus issues and ear issues including dizziness and had a recent scan for this as well. Thinks he may have had a mild cold too which may have affected his BP.  Per wife, he did look flushed over the weekend but did not have a fever and is doing better now. BP is stable. No further issues at this time.  He is currently on lisinopril -hydrochlorothiazide  20-25 mg daily.       Current Medication:  Outpatient Encounter Medications as of 08/02/2024  Medication Sig   lisinopril -hydrochlorothiazide  (ZESTORETIC ) 20-25 MG tablet Take 1 tablet by mouth daily.   pneumococcal 20-valent conjugate vaccine (PREVNAR 20) 0.5 ML injection Inject 0.5 mLs into the muscle once as needed for up to 1 dose for immunization.   rosuvastatin  (CRESTOR ) 10 MG tablet Take 1 tablet (10 mg total) by mouth at bedtime.   tadalafil  (CIALIS ) 10 MG tablet Take 1 tablet (10 mg total) by mouth daily as needed.   Zoster Vaccine Adjuvanted [facility]) injection Inject 0.5 mLs into the muscle once as needed for up to 1 dose (vaccination).   Facility-Administered Encounter Medications as of 08/02/2024  Medication   sodium chloride  (PF) 0.9 % injection 100 mL      Medical History: Past Medical History:  Diagnosis Date   Hypertension      Vital Signs: BP 134/82   Pulse 60   Temp (!) 96.9 F (36.1 C)   Resp 16   Ht 6' 2.5 (1.892 m)   Wt 216 lb (98 kg)   SpO2 97%   BMI 27.36 kg/m    Review of Systems  HENT:  Positive for postnasal drip.        Ear issues    Respiratory: Negative.   Negative for cough, chest tightness, shortness of breath and wheezing.   Cardiovascular: Negative.  Negative for chest pain and palpitations.  Neurological:  Positive for dizziness.    Physical Exam Vitals reviewed.  Constitutional:      General: He is not in acute distress.    Appearance: Normal appearance. He is not ill-appearing.  HENT:     Head: Normocephalic and atraumatic.  Eyes:     Pupils: Pupils are equal, round, and reactive to light.  Cardiovascular:     Rate and Rhythm: Normal rate and regular rhythm.  Pulmonary:     Effort: Pulmonary effort is normal. No respiratory distress.  Neurological:     Mental Status: He is alert and oriented to person, place, and time.  Psychiatric:        Mood and Affect: Mood normal.        Behavior: Behavior normal.       Assessment/Plan: 1. Primary hypertension (Primary) BP is now improved and is stable. He reports his home BP this morning was good too. Discussed red flags to look out for as well as when to call the office and when to go to the ER. Patient verbalized understanding of this.   2. Prediabetes Stable, continue healthy diet and regular exercise.   3.  Mixed hyperlipidemia Continue rosuvastatin  as prescribed.    General Counseling: Douglas Heath verbalizes understanding of the findings of todays visit and agrees with plan of treatment. I have discussed any further diagnostic evaluation that may be needed or ordered today. We also reviewed his medications today. he has been encouraged to call the office with any questions or concerns that should arise related to todays visit.    Counseling:    No orders of the defined types were placed in this encounter.   No orders of the defined types were placed in this encounter.   Return if symptoms worsen or fail to improve.  [place] Controlled Substance Database was reviewed by me for overdose risk score (ORS)  Time spent:30 Minutes Time spent with patient included reviewing  progress notes, labs, imaging studies, and discussing plan for follow up.   This patient was seen by Mardy Maxin, FNP-C in collaboration with Dr. Sigrid Bathe as a part of collaborative care agreement.  Darcell Sabino R. Maxin, MSN, FNP-C Internal Medicine

## 2024-08-08 DIAGNOSIS — H903 Sensorineural hearing loss, bilateral: Secondary | ICD-10-CM | POA: Diagnosis not present

## 2024-08-08 DIAGNOSIS — R42 Dizziness and giddiness: Secondary | ICD-10-CM | POA: Diagnosis not present

## 2024-11-26 ENCOUNTER — Ambulatory Visit: Admitting: Nurse Practitioner
